# Patient Record
Sex: Male | Born: 1965 | Race: White | Hispanic: No | Marital: Married | Smoking: Current some day smoker
Health system: Southern US, Community
[De-identification: ages and names within clinical notes are randomized; demographics above are authoritative.]

## PROBLEM LIST (undated history)

## (undated) DIAGNOSIS — D649 Anemia, unspecified: Secondary | ICD-10-CM

## (undated) DIAGNOSIS — F1910 Other psychoactive substance abuse, uncomplicated: Secondary | ICD-10-CM

## (undated) DIAGNOSIS — K429 Umbilical hernia without obstruction or gangrene: Secondary | ICD-10-CM

## (undated) DIAGNOSIS — K219 Gastro-esophageal reflux disease without esophagitis: Secondary | ICD-10-CM

## (undated) DIAGNOSIS — B019 Varicella without complication: Secondary | ICD-10-CM

## (undated) DIAGNOSIS — J439 Emphysema, unspecified: Secondary | ICD-10-CM

## (undated) HISTORY — DX: Other psychoactive substance abuse, uncomplicated: F19.10

## (undated) HISTORY — DX: Varicella without complication: B01.9

## (undated) HISTORY — DX: Gastro-esophageal reflux disease without esophagitis: K21.9

## (undated) HISTORY — PX: POLYPECTOMY: SHX149

## (undated) HISTORY — DX: Emphysema, unspecified: J43.9

## (undated) HISTORY — DX: Anemia, unspecified: D64.9

## (undated) HISTORY — PX: TONSILLECTOMY: SUR1361

## (undated) HISTORY — PX: WISDOM TOOTH EXTRACTION: SHX21

---

## 1993-12-19 HISTORY — PX: INGUINAL HERNIA REPAIR: SUR1180

## 2007-12-20 HISTORY — PX: ACHILLES TENDON REPAIR: SUR1153

## 2010-03-15 ENCOUNTER — Emergency Department (HOSPITAL_COMMUNITY): Admission: EM | Admit: 2010-03-15 | Discharge: 2010-03-15 | Payer: Self-pay | Admitting: Family Medicine

## 2010-05-17 ENCOUNTER — Emergency Department (HOSPITAL_COMMUNITY): Admission: EM | Admit: 2010-05-17 | Discharge: 2010-05-17 | Payer: Self-pay | Admitting: Family Medicine

## 2013-05-03 ENCOUNTER — Ambulatory Visit: Payer: Self-pay | Admitting: Internal Medicine

## 2015-11-17 ENCOUNTER — Observation Stay (HOSPITAL_COMMUNITY): Payer: PRIVATE HEALTH INSURANCE | Admitting: Certified Registered"

## 2015-11-17 ENCOUNTER — Observation Stay (HOSPITAL_COMMUNITY)
Admission: EM | Admit: 2015-11-17 | Discharge: 2015-11-18 | Disposition: A | Discharge status: Home / Self Care | Payer: PRIVATE HEALTH INSURANCE | Attending: General Surgery | Admitting: General Surgery

## 2015-11-17 ENCOUNTER — Encounter (HOSPITAL_COMMUNITY): Payer: Self-pay | Admitting: Emergency Medicine

## 2015-11-17 ENCOUNTER — Encounter (HOSPITAL_COMMUNITY)
Admission: EM | Disposition: A | Discharge status: Home / Self Care | Payer: Self-pay | Source: Home / Self Care | Attending: Physician Assistant

## 2015-11-17 ENCOUNTER — Emergency Department (HOSPITAL_COMMUNITY): Payer: PRIVATE HEALTH INSURANCE

## 2015-11-17 DIAGNOSIS — F17210 Nicotine dependence, cigarettes, uncomplicated: Secondary | ICD-10-CM | POA: Insufficient documentation

## 2015-11-17 DIAGNOSIS — R1084 Generalized abdominal pain: Secondary | ICD-10-CM | POA: Diagnosis present

## 2015-11-17 DIAGNOSIS — K3580 Unspecified acute appendicitis: Secondary | ICD-10-CM | POA: Diagnosis not present

## 2015-11-17 HISTORY — DX: Umbilical hernia without obstruction or gangrene: K42.9

## 2015-11-17 HISTORY — PX: APPENDECTOMY: SHX54

## 2015-11-17 HISTORY — PX: LAPAROSCOPIC APPENDECTOMY: SHX408

## 2015-11-17 HISTORY — DX: Unspecified acute appendicitis: K35.80

## 2015-11-17 LAB — URINALYSIS, ROUTINE W REFLEX MICROSCOPIC
BILIRUBIN URINE: NEGATIVE
GLUCOSE, UA: NEGATIVE mg/dL
Hgb urine dipstick: NEGATIVE
Ketones, ur: 15 mg/dL — AB
Nitrite: NEGATIVE
Protein, ur: NEGATIVE mg/dL
Specific Gravity, Urine: 1.028 (ref 1.005–1.030)
pH: 6 (ref 5.0–8.0)

## 2015-11-17 LAB — COMPREHENSIVE METABOLIC PANEL
ALT: 13 U/L — ABNORMAL LOW (ref 17–63)
ANION GAP: 9 (ref 5–15)
AST: 21 U/L (ref 15–41)
Albumin: 4.2 g/dL (ref 3.5–5.0)
Alkaline Phosphatase: 53 U/L (ref 38–126)
BILIRUBIN TOTAL: 0.9 mg/dL (ref 0.3–1.2)
BUN: 8 mg/dL (ref 6–20)
CHLORIDE: 106 mmol/L (ref 101–111)
CO2: 22 mmol/L (ref 22–32)
Calcium: 8.9 mg/dL (ref 8.9–10.3)
Creatinine, Ser: 0.93 mg/dL (ref 0.61–1.24)
Glucose, Bld: 122 mg/dL — ABNORMAL HIGH (ref 65–99)
POTASSIUM: 3.6 mmol/L (ref 3.5–5.1)
Sodium: 137 mmol/L (ref 135–145)
TOTAL PROTEIN: 6.8 g/dL (ref 6.5–8.1)

## 2015-11-17 LAB — CBC WITH DIFFERENTIAL/PLATELET
BASOS ABS: 0 10*3/uL (ref 0.0–0.1)
Basophils Relative: 0 %
EOS PCT: 0 %
Eosinophils Absolute: 0 10*3/uL (ref 0.0–0.7)
HEMATOCRIT: 43.3 % (ref 39.0–52.0)
Hemoglobin: 15.2 g/dL (ref 13.0–17.0)
LYMPHS ABS: 1.1 10*3/uL (ref 0.7–4.0)
Lymphocytes Relative: 8 %
MCH: 34.2 pg — AB (ref 26.0–34.0)
MCHC: 35.1 g/dL (ref 30.0–36.0)
MCV: 97.5 fL (ref 78.0–100.0)
MONO ABS: 1.3 10*3/uL — AB (ref 0.1–1.0)
MONOS PCT: 9 %
NEUTROS ABS: 11.8 10*3/uL — AB (ref 1.7–7.7)
Neutrophils Relative %: 83 %
PLATELETS: 215 10*3/uL (ref 150–400)
RBC: 4.44 MIL/uL (ref 4.22–5.81)
RDW: 12 % (ref 11.5–15.5)
WBC: 14.2 10*3/uL — ABNORMAL HIGH (ref 4.0–10.5)

## 2015-11-17 LAB — URINE MICROSCOPIC-ADD ON

## 2015-11-17 LAB — LIPASE, BLOOD: LIPASE: 27 U/L (ref 11–51)

## 2015-11-17 SURGERY — APPENDECTOMY, LAPAROSCOPIC
Anesthesia: General | Site: Abdomen

## 2015-11-17 MED ORDER — ROCURONIUM BROMIDE 100 MG/10ML IV SOLN
INTRAVENOUS | Status: DC | PRN
  Administered 2015-11-17: 30 mg via INTRAVENOUS

## 2015-11-17 MED ORDER — IOHEXOL 300 MG/ML  SOLN
100.0000 mL | Freq: Once | INTRAMUSCULAR | Status: AC | PRN
  Administered 2015-11-17: 100 mL via INTRAVENOUS

## 2015-11-17 MED ORDER — ONDANSETRON HCL 4 MG/2ML IJ SOLN
4.0000 mg | Freq: Once | INTRAMUSCULAR | Status: AC
  Administered 2015-11-17: 4 mg via INTRAVENOUS
  Filled 2015-11-17: qty 2

## 2015-11-17 MED ORDER — POTASSIUM CHLORIDE IN NACL 20-0.9 MEQ/L-% IV SOLN
INTRAVENOUS | Status: DC
  Administered 2015-11-17 – 2015-11-18 (×2): via INTRAVENOUS
  Filled 2015-11-17 (×2): qty 1000

## 2015-11-17 MED ORDER — ONDANSETRON 4 MG PO TBDP: 4.0000 mg | ORAL_TABLET | Freq: Four times a day (QID) | ORAL | Status: DC | PRN

## 2015-11-17 MED ORDER — OXYCODONE HCL 5 MG/5ML PO SOLN: 5.0000 mg | Freq: Once | ORAL | Status: DC | PRN

## 2015-11-17 MED ORDER — SODIUM CHLORIDE 0.9 % IR SOLN
Status: DC | PRN
  Administered 2015-11-17: 1000 mL

## 2015-11-17 MED ORDER — FENTANYL CITRATE (PF) 250 MCG/5ML IJ SOLN
INTRAMUSCULAR | Status: AC
  Filled 2015-11-17: qty 5

## 2015-11-17 MED ORDER — DEXTROSE 5 % IV SOLN
2.0000 g | INTRAVENOUS | Status: DC
  Administered 2015-11-17: 2 g via INTRAVENOUS
  Filled 2015-11-17: qty 2

## 2015-11-17 MED ORDER — KETOROLAC TROMETHAMINE 30 MG/ML IJ SOLN
30.0000 mg | Freq: Once | INTRAMUSCULAR | Status: AC
  Administered 2015-11-17: 30 mg via INTRAVENOUS
  Filled 2015-11-17: qty 1

## 2015-11-17 MED ORDER — SUCCINYLCHOLINE CHLORIDE 20 MG/ML IJ SOLN
INTRAMUSCULAR | Status: DC | PRN
  Administered 2015-11-17: 150 mg via INTRAVENOUS

## 2015-11-17 MED ORDER — PROPOFOL 10 MG/ML IV BOLUS
INTRAVENOUS | Status: AC
  Filled 2015-11-17: qty 20

## 2015-11-17 MED ORDER — SUGAMMADEX SODIUM 200 MG/2ML IV SOLN
INTRAVENOUS | Status: AC
  Filled 2015-11-17: qty 2

## 2015-11-17 MED ORDER — MIDAZOLAM HCL 5 MG/5ML IJ SOLN
INTRAMUSCULAR | Status: DC | PRN
  Administered 2015-11-17: 2 mg via INTRAVENOUS

## 2015-11-17 MED ORDER — MIDAZOLAM HCL 2 MG/2ML IJ SOLN
INTRAMUSCULAR | Status: AC
  Filled 2015-11-17: qty 2

## 2015-11-17 MED ORDER — ONDANSETRON HCL 4 MG/2ML IJ SOLN: 4.0000 mg | Freq: Once | INTRAMUSCULAR | Status: DC | PRN

## 2015-11-17 MED ORDER — METRONIDAZOLE IN NACL 5-0.79 MG/ML-% IV SOLN
500.0000 mg | Freq: Three times a day (TID) | INTRAVENOUS | Status: DC
  Administered 2015-11-17: 500 mg via INTRAVENOUS
  Filled 2015-11-17 (×3): qty 100

## 2015-11-17 MED ORDER — SUGAMMADEX SODIUM 200 MG/2ML IV SOLN
INTRAVENOUS | Status: DC | PRN
  Administered 2015-11-17: 158.8 mg via INTRAVENOUS

## 2015-11-17 MED ORDER — 0.9 % SODIUM CHLORIDE (POUR BTL) OPTIME
TOPICAL | Status: DC | PRN
  Administered 2015-11-17: 1000 mL

## 2015-11-17 MED ORDER — PROPOFOL 10 MG/ML IV BOLUS
INTRAVENOUS | Status: DC | PRN
  Administered 2015-11-17: 170 mg via INTRAVENOUS

## 2015-11-17 MED ORDER — LIDOCAINE HCL 4 % MT SOLN
OROMUCOSAL | Status: DC | PRN
  Administered 2015-11-17: 4 mL via TOPICAL

## 2015-11-17 MED ORDER — LACTATED RINGERS IV SOLN
INTRAVENOUS | Status: DC
  Administered 2015-11-17: 12:00:00 via INTRAVENOUS

## 2015-11-17 MED ORDER — OXYCODONE HCL 5 MG PO TABS: 5.0000 mg | ORAL_TABLET | Freq: Once | ORAL | Status: DC | PRN

## 2015-11-17 MED ORDER — LIDOCAINE HCL (CARDIAC) 20 MG/ML IV SOLN
INTRAVENOUS | Status: DC | PRN
  Administered 2015-11-17: 50 mg via INTRAVENOUS

## 2015-11-17 MED ORDER — METHOCARBAMOL 500 MG PO TABS: 500.0000 mg | ORAL_TABLET | Freq: Four times a day (QID) | ORAL | Status: DC | PRN

## 2015-11-17 MED ORDER — DIPHENHYDRAMINE HCL 50 MG/ML IJ SOLN: 25.0000 mg | Freq: Four times a day (QID) | INTRAMUSCULAR | Status: DC | PRN

## 2015-11-17 MED ORDER — HYDROMORPHONE HCL 1 MG/ML IJ SOLN: 0.2500 mg | INTRAMUSCULAR | Status: DC | PRN

## 2015-11-17 MED ORDER — FENTANYL CITRATE (PF) 100 MCG/2ML IJ SOLN
INTRAMUSCULAR | Status: DC | PRN
  Administered 2015-11-17 (×5): 50 ug via INTRAVENOUS

## 2015-11-17 MED ORDER — BUPIVACAINE HCL (PF) 0.25 % IJ SOLN
INTRAMUSCULAR | Status: AC
  Filled 2015-11-17: qty 30

## 2015-11-17 MED ORDER — ACETAMINOPHEN 650 MG RE SUPP: 650.0000 mg | Freq: Four times a day (QID) | RECTAL | Status: DC | PRN

## 2015-11-17 MED ORDER — ONDANSETRON HCL 4 MG/2ML IJ SOLN
INTRAMUSCULAR | Status: DC | PRN
  Administered 2015-11-17: 4 mg via INTRAVENOUS

## 2015-11-17 MED ORDER — HYDROCODONE-ACETAMINOPHEN 5-325 MG PO TABS
1.0000 | ORAL_TABLET | ORAL | Status: DC | PRN
  Administered 2015-11-17: 2 via ORAL
  Filled 2015-11-17: qty 2

## 2015-11-17 MED ORDER — DIPHENHYDRAMINE HCL 25 MG PO CAPS: 25.0000 mg | ORAL_CAPSULE | Freq: Four times a day (QID) | ORAL | Status: DC | PRN

## 2015-11-17 MED ORDER — MORPHINE SULFATE (PF) 2 MG/ML IV SOLN: 1.0000 mg | INTRAVENOUS | Status: DC | PRN

## 2015-11-17 MED ORDER — HYDRALAZINE HCL 20 MG/ML IJ SOLN: 10.0000 mg | INTRAMUSCULAR | Status: DC | PRN

## 2015-11-17 MED ORDER — BUPIVACAINE HCL 0.25 % IJ SOLN
INTRAMUSCULAR | Status: DC | PRN
  Administered 2015-11-17: 5 mL

## 2015-11-17 MED ORDER — ACETAMINOPHEN 325 MG PO TABS
650.0000 mg | ORAL_TABLET | Freq: Four times a day (QID) | ORAL | Status: DC | PRN
  Administered 2015-11-18: 650 mg via ORAL
  Filled 2015-11-17: qty 2

## 2015-11-17 MED ORDER — ONDANSETRON HCL 4 MG/2ML IJ SOLN
4.0000 mg | Freq: Four times a day (QID) | INTRAMUSCULAR | Status: DC | PRN
  Administered 2015-11-18: 4 mg via INTRAVENOUS
  Filled 2015-11-17: qty 2

## 2015-11-17 MED ORDER — LACTATED RINGERS IV SOLN
INTRAVENOUS | Status: DC | PRN
  Administered 2015-11-17 (×2): via INTRAVENOUS

## 2015-11-17 MED ORDER — POVIDONE-IODINE 10 % EX SOLN
CUTANEOUS | Status: DC | PRN
  Administered 2015-11-17: 1 via TOPICAL

## 2015-11-17 SURGICAL SUPPLY — 45 items
APPLIER CLIP 5 13 M/L LIGAMAX5 (MISCELLANEOUS)
BENZOIN TINCTURE PRP APPL 2/3 (GAUZE/BANDAGES/DRESSINGS) ×2 IMPLANT
BLADE SURG ROTATE 9660 (MISCELLANEOUS) ×2 IMPLANT
CANISTER SUCTION 2500CC (MISCELLANEOUS) ×2 IMPLANT
CHLORAPREP W/TINT 26ML (MISCELLANEOUS) ×2 IMPLANT
CLIP APPLIE 5 13 M/L LIGAMAX5 (MISCELLANEOUS) IMPLANT
COVER SURGICAL LIGHT HANDLE (MISCELLANEOUS) ×2 IMPLANT
COVER TRANSDUCER ULTRASND (DRAPES) ×2 IMPLANT
DEVICE TROCAR PUNCTURE CLOSURE (ENDOMECHANICALS) ×2 IMPLANT
ELECT REM PT RETURN 9FT ADLT (ELECTROSURGICAL) ×2
ELECTRODE REM PT RTRN 9FT ADLT (ELECTROSURGICAL) ×1 IMPLANT
ENDOLOOP SUT PDS II  0 18 (SUTURE) ×3
ENDOLOOP SUT PDS II 0 18 (SUTURE) ×3 IMPLANT
GAUZE SPONGE 2X2 8PLY STRL LF (GAUZE/BANDAGES/DRESSINGS) ×1 IMPLANT
GLOVE BIO SURGEON STRL SZ 6 (GLOVE) ×2 IMPLANT
GLOVE BIO SURGEON STRL SZ 6.5 (GLOVE) ×2 IMPLANT
GLOVE BIO SURGEON STRL SZ7 (GLOVE) ×2 IMPLANT
GLOVE BIO SURGEON STRL SZ7.5 (GLOVE) ×2 IMPLANT
GLOVE BIOGEL PI IND STRL 7.0 (GLOVE) ×2 IMPLANT
GLOVE BIOGEL PI INDICATOR 7.0 (GLOVE) ×2
GOWN STRL REUS W/ TWL LRG LVL3 (GOWN DISPOSABLE) ×2 IMPLANT
GOWN STRL REUS W/ TWL XL LVL3 (GOWN DISPOSABLE) ×1 IMPLANT
GOWN STRL REUS W/TWL LRG LVL3 (GOWN DISPOSABLE) ×2
GOWN STRL REUS W/TWL XL LVL3 (GOWN DISPOSABLE) ×1
KIT BASIN OR (CUSTOM PROCEDURE TRAY) ×2 IMPLANT
KIT ROOM TURNOVER OR (KITS) ×2 IMPLANT
NEEDLE INSUFFLATION 14GA 120MM (NEEDLE) ×2 IMPLANT
NS IRRIG 1000ML POUR BTL (IV SOLUTION) ×2 IMPLANT
PAD ARMBOARD 7.5X6 YLW CONV (MISCELLANEOUS) ×4 IMPLANT
SCISSORS LAP 5X35 DISP (ENDOMECHANICALS) ×2 IMPLANT
SET IRRIG TUBING LAPAROSCOPIC (IRRIGATION / IRRIGATOR) ×2 IMPLANT
SLEEVE ENDOPATH XCEL 5M (ENDOMECHANICALS) ×2 IMPLANT
SPECIMEN JAR SMALL (MISCELLANEOUS) ×2 IMPLANT
SPONGE GAUZE 2X2 STER 10/PKG (GAUZE/BANDAGES/DRESSINGS) ×1
STRIP CLOSURE SKIN 1/2X4 (GAUZE/BANDAGES/DRESSINGS) ×2 IMPLANT
SUT MNCRL AB 3-0 PS2 18 (SUTURE) ×2 IMPLANT
SUT SILK 2 0 SH (SUTURE) IMPLANT
TAPE CLOTH SOFT 2X10 (GAUZE/BANDAGES/DRESSINGS) ×2 IMPLANT
TOWEL OR 17X24 6PK STRL BLUE (TOWEL DISPOSABLE) ×2 IMPLANT
TOWEL OR 17X26 10 PK STRL BLUE (TOWEL DISPOSABLE) ×2 IMPLANT
TRAY FOLEY CATH 16FR SILVER (SET/KITS/TRAYS/PACK) ×2 IMPLANT
TRAY LAPAROSCOPIC MC (CUSTOM PROCEDURE TRAY) ×2 IMPLANT
TROCAR XCEL NON-BLD 11X100MML (ENDOMECHANICALS) ×2 IMPLANT
TROCAR XCEL NON-BLD 5MMX100MML (ENDOMECHANICALS) ×2 IMPLANT
TUBING INSUFFLATION (TUBING) ×2 IMPLANT

## 2015-11-17 NOTE — Anesthesia Procedure Notes (Signed)
Procedure Name: Intubation Date/Time: 11/17/2015 12:17 PM Performed by: Suzy Bouchard Pre-anesthesia Checklist: Patient identified, Emergency Drugs available, Suction available, Timeout performed and Patient being monitored Patient Re-evaluated:Patient Re-evaluated prior to inductionOxygen Delivery Method: Circle system utilized Preoxygenation: Pre-oxygenation with 100% oxygen Intubation Type: IV induction, Rapid sequence and Cricoid Pressure applied Laryngoscope Size: Miller and 2 Grade View: Grade I Tube type: Oral Laser Tube: Cuffed inflated with minimal occlusive pressure - saline Tube size: 7.5 mm Airway Equipment and Method: Stylet and LTA kit utilized Placement Confirmation: ETT inserted through vocal cords under direct vision,  positive ETCO2 and breath sounds checked- equal and bilateral Secured at: 21 cm Tube secured with: Tape Dental Injury: Teeth and Oropharynx as per pre-operative assessment

## 2015-11-17 NOTE — H&P (Signed)
[facility] Admission Note  Dennis Richard Aug 09, 1966  [id number].    Requesting MD: Delrae Rend, PA-C Chief Complaint/Reason for Consult: Acute appendicitis  HPI:  49 y/o white male with history of tobacco use, bilateral inguinal hernias s/p repair 1995, and unrepaired umbilical hernia who presents to [facility] for evaluation of abdominal pain.  He states he was in his usual state of health until yesterday afternoon when he started experiencing mid abdominal pain while at work.  His pain is now in his RLQ although much improved since administartion of pain medication.  Pain radiates to his right lower back.  5/10 pain currently.  No precipitating or alleviating factors.  He reports associated nausea, dry heaving, chills, and loss of appetite.  Attempted self induce vomiting because he thought it was gas pains.  He has an umbilical hernia which bothers him sometimes, but does not have any current pain.  Denies obstructive symptoms.  BM and flatus daily.  Last meal 3pm yesterday.  States he has been able to keep fluids down.  Denies diarrhea, fever, dysuria, chest pain, SOB.   Endorses a few beers/wine a night and a 6-8 beers on the weekend.  Smokes 1/2 PPD, no drug use.  Also c/o left hip pain and wanted to know if his CT showed any fractures.  He notes he had a right skin infection after his right inguinal hernia repair in 1995.   ROS: All systems reviewed and otherwise negative except for as above  No family history on file.  Past Medical History  Diagnosis Date  . Umbilical hernia     Past Surgical History  Procedure Laterality Date  . Inguinal hernia repair Bilateral [place]  . Ankle surgery Right 2009    achilles rupture and repair  . Tonsilectomy/adenoidectomy with myringotomy      49 y/o     Social History:  reports that he has been smoking Cigarettes.  He has a 16 pack-year smoking history. He does not have any smokeless tobacco history on file. He  reports that he drinks alcohol. He reports that he does not use illicit drugs.  Allergies: No Known Allergies   (Not in a hospital admission)  Blood pressure 118/75, pulse 93, temperature 99.1 F (37.3 C), temperature source Oral, resp. rate 16, height $RemoveBe'5\' 7"'JXITwsKoO$  (1.702 m), weight 79.379 kg (175 lb), SpO2 92 %. Physical Exam: General: pleasant, WD/WN white male who is laying in bed in NAD HEENT: head is normocephalic, atraumatic.  Sclera are noninjected.  PERRL.  Ears and nose without any masses or lesions.  Mouth is pink and moist Heart: regular, rate, and rhythm.  No obvious murmurs, gallops, or rubs noted.  Palpable pedal pulses bilaterally Lungs: CTAB, no wheezes, rhonchi, or rales noted.  Respiratory effort nonlabored Abd: soft, tender in RLQ, ND, +BS, no masses, hernias, or organomegaly, b/l inguinal scars well healed, no inguinal hernia.  Soft reducible umbilical hernia containing fat per CT. MS: all 4 extremities are symmetrical with no cyanosis, clubbing, or edema. Skin: warm and dry with no masses, lesions, or rashes Psych: A&Ox3 with an appropriate affect.   Results for orders placed or performed during the hospital encounter of 11/17/15 (from the past 48 hour(s))  CBC with Differential     Status: Abnormal   Collection Time: 11/17/15  8:00 AM  Result Value Ref Range   WBC 14.2 (H) 4.0 - 10.5 K/uL   RBC 4.44 4.22 - 5.81 MIL/uL   Hemoglobin  15.2 13.0 - 17.0 g/dL   HCT 43.3 39.0 - 52.0 %   MCV 97.5 78.0 - 100.0 fL   MCH 34.2 (H) 26.0 - 34.0 pg   MCHC 35.1 30.0 - 36.0 g/dL   RDW 12.0 11.5 - 15.5 %   Platelets 215 150 - 400 K/uL   Neutrophils Relative % 83 %   Neutro Abs 11.8 (H) 1.7 - 7.7 K/uL   Lymphocytes Relative 8 %   Lymphs Abs 1.1 0.7 - 4.0 K/uL   Monocytes Relative 9 %   Monocytes Absolute 1.3 (H) 0.1 - 1.0 K/uL   Eosinophils Relative 0 %   Eosinophils Absolute 0.0 0.0 - 0.7 K/uL   Basophils Relative 0 %   Basophils Absolute 0.0 0.0 - 0.1 K/uL  Comprehensive  metabolic panel     Status: Abnormal   Collection Time: 11/17/15  8:00 AM  Result Value Ref Range   Sodium 137 135 - 145 mmol/L   Potassium 3.6 3.5 - 5.1 mmol/L   Chloride 106 101 - 111 mmol/L   CO2 22 22 - 32 mmol/L   Glucose, Bld 122 (H) 65 - 99 mg/dL   BUN 8 6 - 20 mg/dL   Creatinine, Ser 0.93 0.61 - 1.24 mg/dL   Calcium 8.9 8.9 - 10.3 mg/dL   Total Protein 6.8 6.5 - 8.1 g/dL   Albumin 4.2 3.5 - 5.0 g/dL   AST 21 15 - 41 U/L   ALT 13 (L) 17 - 63 U/L   Alkaline Phosphatase 53 38 - 126 U/L   Total Bilirubin 0.9 0.3 - 1.2 mg/dL   GFR calc non Af Amer >60 >60 mL/min   GFR calc Af Amer >60 >60 mL/min    Comment: (NOTE) The eGFR has been calculated using the CKD EPI equation. This calculation has not been validated in all clinical situations. eGFR's persistently <60 mL/min signify possible Chronic Kidney Disease.    Anion gap 9 5 - 15  Lipase, blood     Status: None   Collection Time: 11/17/15  8:00 AM  Result Value Ref Range   Lipase 27 11 - 51 U/L  Urinalysis, Routine w reflex microscopic (not at [facility])     Status: Abnormal   Collection Time: 11/17/15  8:40 AM  Result Value Ref Range   Color, Urine YELLOW YELLOW   APPearance CLEAR CLEAR   Specific Gravity, Urine 1.028 1.005 - 1.030   pH 6.0 5.0 - 8.0   Glucose, UA NEGATIVE NEGATIVE mg/dL   Hgb urine dipstick NEGATIVE NEGATIVE   Bilirubin Urine NEGATIVE NEGATIVE   Ketones, ur 15 (A) NEGATIVE mg/dL   Protein, ur NEGATIVE NEGATIVE mg/dL   Nitrite NEGATIVE NEGATIVE   Leukocytes, UA TRACE (A) NEGATIVE  Urine microscopic-add on     Status: Abnormal   Collection Time: 11/17/15  8:40 AM  Result Value Ref Range   Squamous Epithelial / LPF 0-5 (A) NONE SEEN    Comment: Please note change in reference range.   WBC, UA 0-5 0 - 5 WBC/hpf    Comment: Please note change in reference range.   RBC / HPF 0-5 0 - 5 RBC/hpf    Comment: Please note change in reference range.   Bacteria, UA RARE (A) NONE SEEN    Comment: Please note  change in reference range.   Urine-Other MUCOUS PRESENT    Ct Abdomen Pelvis W Contrast  11/17/2015  CLINICAL DATA:  Diffuse abdominal pain starting yesterday p.m., nausea EXAM: CT ABDOMEN AND  PELVIS WITH CONTRAST TECHNIQUE: Multidetector CT imaging of the abdomen and pelvis was performed using the standard protocol following bolus administration of intravenous contrast. CONTRAST:  115mL OMNIPAQUE IOHEXOL 300 MG/ML  SOLN COMPARISON:  None. FINDINGS: The lung bases are unremarkable. Sagittal images of the spine shows degenerative changes lumbar spine. Disc space flattening with anterior spurring at L3-L4 and L4-L5 level. Enhanced liver pancreas and adrenal glands are unremarkable. There is a lobulated splenic cyst measures 3.2 by 1.9 cm. No aortic aneurysm. Kidneys are symmetrical in size and enhancement. No hydronephrosis or hydroureter. Delayed renal images shows bilateral renal symmetrical excretion. Bilateral visualized proximal ureter is unremarkable. Moderate stool noted in right colon and transverse colon. There is nonspecific mild thickening of cecal wall. The terminal ileum is unremarkable. There is abnormal enhancement and thickening of the appendix. Stranding of surrounding fat. Small amount of surrounding fluid. The appendix is best visualized in axial image 68 measures 1 cm in diameter. Findings are consistent with acute appendicitis. Prostate gland and seminal vesicles are unremarkable. No inguinal adenopathy. The urinary bladder is under distended. Nonspecific mild thickening of urinary bladder wall. There is a umbilical hernia containing fat measures 2.6 x 2.4 cm without evidence of acute complication. IMPRESSION: 1. There is abnormal enhancement and thickening of the appendix. Small amount of periappendiceal fluid. Findings are consistent with acute appendicitis. No definite evidence of perforation. 2. Nonspecific mild thickening of cecal wall probable satellite inflammation. 3. No small bowel  obstruction. 4. Unremarkable terminal ileum. 5. Moderate stool in right colon and transverse colon. 6. There is a umbilical hernia containing fat measures 2.6 x 2.4 cm. No evidence of acute complication. These results were called by telephone at the time of interpretation on 11/17/2015 at 9:12 am to Dr. Delrae Rend , who verbally acknowledged these results. Electronically Signed   By: Lahoma Crocker M.D.   On: 11/17/2015 09:12      Assessment/Plan Acute appendicitis Leukocytosis  Plan: 1.  Admit to [facility], urgent OR for lap appy 2.  NPO, bowel rest, IVF, pain control, antiemetics, antibiotics (rocephin/flagyl) 3.  Discussed conservative management vs surgical interventions.  Discussed risks including bleeding, infection, injury to intra-abdominal structures, conversion to open, leak and drain placement.  The patient would like to proceed with surgical interventions.  His wife will come to the hospital after 3pm when she picks up their child at school.   4.  Discussed post-op course, patient was interested in discharging home today if possible.   Nat Christen, [facility] 11/17/2015, 10:26 AM Pager: [phone]

## 2015-11-17 NOTE — Transfer of Care (Signed)
Immediate Anesthesia Transfer of Care Note  Patient: Dennis Richard  Procedure(s) Performed: Procedure(s): APPENDECTOMY LAPAROSCOPIC (N/A)  Patient Location: PACU  Anesthesia Type:General  Level of Consciousness: sedated  Airway & Oxygen Therapy: Patient Spontanous Breathing and Patient connected to nasal cannula oxygen  Post-op Assessment: Report given to RN and Post -op Vital signs reviewed and stable  Post vital signs: Reviewed and stable  Last Vitals:  Filed Vitals:   11/17/15 1000 11/17/15 1100  BP: 117/76 110/79  Pulse: 89 87  Temp:    Resp:      Complications: No apparent anesthesia complications

## 2015-11-17 NOTE — ED Provider Notes (Signed)
CSN: [id number]     Arrival date & time 11/17/15  0734 History   First MD Initiated Contact with Patient 11/17/15 [phone]     No chief complaint on file.  HPI   Dennis Richard is an 49 y.o. male with history of bilateral inguinal hernias s/p repair and unrepaired umbilical hernia who presents to the ED for evaluation of abdominal pain. He states he was in his usual state of health until yesterday afternoon when he started experiencing epigastric pain. He states the pain has now migrated to his RLQ. He reports associated nausea and loss of appetite. Denies emesis. States he has been able to keep fluids down. Denies diarrhea. Denies fever, chills, dysuria, chest pain, SOB. He states he still has his appendix and gallbladder. Endorses a few beers a night and a six pack on the weekend. Denies tobacco, MJ, or other drugs.   No past medical history on file. No past surgical history on file. No family history on file. Social History  Substance Use Topics  . Smoking status: Not on file  . Smokeless tobacco: Not on file  . Alcohol Use: Not on file    Review of Systems  All other systems reviewed and are negative.     Allergies  Review of patient's allergies indicates not on file.  Home Medications   Prior to Admission medications   Not on File   There were no vitals taken for this visit. Physical Exam  Constitutional: He is oriented to person, place, and time. No distress.  HENT:  Right Ear: External ear normal.  Left Ear: External ear normal.  Nose: Nose normal.  Mouth/Throat: Oropharynx is clear and moist. No oropharyngeal exudate.  Eyes: Conjunctivae and EOM are normal. Pupils are equal, round, and reactive to light.  Neck: Normal range of motion. Neck supple.  Cardiovascular: Normal rate, regular rhythm, normal heart sounds and intact distal pulses.   Pulmonary/Chest: Effort normal and breath sounds normal. No respiratory distress. He has no wheezes.  Abdominal: Soft. Bowel sounds are  normal. He exhibits no distension. There is no rebound, no guarding and no CVA tenderness.  Marked +TTP RLQ. Negative rebound, rovsing's. No guarding. Mild epigastric tenderness. There is also a small umbilical hernia that is easily reducible. No strangulation. Nontender.   Musculoskeletal: He exhibits no edema.  Neurological: He is alert and oriented to person, place, and time. No cranial nerve deficit.  Skin: Skin is warm and dry. He is not diaphoretic.  Psychiatric: He has a normal mood and affect.  Nursing note and vitals reviewed.   ED Course  Procedures (including critical care time) Labs Review Labs Reviewed  CBC WITH DIFFERENTIAL/PLATELET - Abnormal; Notable for the following:    WBC 14.2 (*)    MCH 34.2 (*)    Neutro Abs 11.8 (*)    Monocytes Absolute 1.3 (*)    All other components within normal limits  COMPREHENSIVE METABOLIC PANEL - Abnormal; Notable for the following:    Glucose, Bld 122 (*)    ALT 13 (*)    All other components within normal limits  URINALYSIS, ROUTINE W REFLEX MICROSCOPIC (NOT AT [facility]) - Abnormal; Notable for the following:    Ketones, ur 15 (*)    Leukocytes, UA TRACE (*)    All other components within normal limits  URINE MICROSCOPIC-ADD ON - Abnormal; Notable for the following:    Squamous Epithelial / LPF 0-5 (*)    Bacteria, UA RARE (*)    All other  components within normal limits  LIPASE, BLOOD    Imaging Review Ct Abdomen Pelvis W Contrast  11/17/2015  CLINICAL DATA:  Diffuse abdominal pain starting yesterday p.m., nausea EXAM: CT ABDOMEN AND PELVIS WITH CONTRAST TECHNIQUE: Multidetector CT imaging of the abdomen and pelvis was performed using the standard protocol following bolus administration of intravenous contrast. CONTRAST:  170mL OMNIPAQUE IOHEXOL 300 MG/ML  SOLN COMPARISON:  None. FINDINGS: The lung bases are unremarkable. Sagittal images of the spine shows degenerative changes lumbar spine. Disc space flattening with anterior  spurring at L3-L4 and L4-L5 level. Enhanced liver pancreas and adrenal glands are unremarkable. There is a lobulated splenic cyst measures 3.2 by 1.9 cm. No aortic aneurysm. Kidneys are symmetrical in size and enhancement. No hydronephrosis or hydroureter. Delayed renal images shows bilateral renal symmetrical excretion. Bilateral visualized proximal ureter is unremarkable. Moderate stool noted in right colon and transverse colon. There is nonspecific mild thickening of cecal wall. The terminal ileum is unremarkable. There is abnormal enhancement and thickening of the appendix. Stranding of surrounding fat. Small amount of surrounding fluid. The appendix is best visualized in axial image 68 measures 1 cm in diameter. Findings are consistent with acute appendicitis. Prostate gland and seminal vesicles are unremarkable. No inguinal adenopathy. The urinary bladder is under distended. Nonspecific mild thickening of urinary bladder wall. There is a umbilical hernia containing fat measures 2.6 x 2.4 cm without evidence of acute complication. IMPRESSION: 1. There is abnormal enhancement and thickening of the appendix. Small amount of periappendiceal fluid. Findings are consistent with acute appendicitis. No definite evidence of perforation. 2. Nonspecific mild thickening of cecal wall probable satellite inflammation. 3. No small bowel obstruction. 4. Unremarkable terminal ileum. 5. Moderate stool in right colon and transverse colon. 6. There is a umbilical hernia containing fat measures 2.6 x 2.4 cm. No evidence of acute complication. These results were called by telephone at the time of interpretation on 11/17/2015 at 9:12 am to Dr. Delrae Rend , who verbally acknowledged these results. Electronically Signed   By: Lahoma Crocker M.D.   On: 11/17/2015 09:12   I have personally reviewed and evaluated these images and lab results as part of my medical decision-making.   EKG Interpretation None      MDM   Final  diagnoses:  Acute appendicitis, unspecified acute appendicitis type    Given pt's tenderness, acute onset of pain, nausea, and loss of appetite, I suspect possible appendicitis. Lower suspicion for cholecystitis or pancreatitis. Possible GERD. Will get cbc, bmp, UA. Will get CT abd/pelvis. Pain and nausea meds given.  CT shows evidence of acute appendicitis. No signs of rupture at this time.  WBC 14.2. UA shows signs of possible contamination. General surgery consulted for admission.    Anne Ng, PA-C 11/18/15 [id number]  Courteney Julio Alm, MD 11/19/15 [phone]

## 2015-11-17 NOTE — Op Note (Signed)
11/17/2015  1:16 PM  PATIENT:  Dennis Richard  49 y.o. male  PRE-OPERATIVE DIAGNOSIS:  Acute appendicitis  POST-OPERATIVE DIAGNOSIS:  Acute appendicitis  PROCEDURE:  Procedure(s): APPENDECTOMY LAPAROSCOPIC (N/A)  SURGEON:  Surgeon(s) and Role:    * Ralene Ok, MD - Primary  ANESTHESIA:   local and general  EBL: 10cc Total I/O In: 1000 [I.V.:1000] Out: 325 [Urine:300; Blood:25]  BLOOD ADMINISTERED:none  DRAINS: none   LOCAL MEDICATIONS USED:  BUPIVICAINE   SPECIMEN:  Source of Specimen:  appendix  DISPOSITION OF SPECIMEN:  PATHOLOGY  COUNTS:  YES  TOURNIQUET:  * No tourniquets in log *  DICTATION: .Dragon Dictation Complications: none  Counts: reported as correct x 2  Findings:  The patient had a acutely inflammed non perforated appendix  Specimen: Appendix  Indications for procedure:  The patient is a 49 year old male with a history of periumbilical pain localized in the right lower quadrant patient had a CT scan which revealed signs consistent with acute appendicitis the patient back in for laparoscopic appendectomy.  Details of the procedure:The patient was taken back to the operating room. The patient was placed in supine position with bilateral SCDs in place.  A foley catheter was place. The patient was prepped and draped in the usual sterile fashion.  After appropriate anitbiotics were confirmed, a time-out was confirmed and all facts were verified.    A pneumoperitoneum of 14 mmHg was obtained via a Veress needle technique in the left lower quadrant quadrant.  A 5 mm trocar and 5 mm camera then placed intra-abdominally there is no injury to any intra-abdominal organs a 10 mm infraumbilical port was placed and direct visualization as was a 5 mm port in the suprapubic area.   The appendix was identified and seen to be non-perforated.  The appendix was cleaned down to the appendiceal base. The mesoappendix was then incised and the appendiceal artery was  cauterized.  The the appendiceal base was clean.  At this time an Endoloop was placed proximallyx2 and one distally and the appendix was transected between these 2. A retrieval bag was then placed into the abdomen and the specimen placed in the bag. The appendiceal stump was cauterized. We evacuate the fluid from the pelvis until the effluent was clear.  The appendix and retrieval  bag was then retrieved via the supraumbilical port. #1 Vicryl was used to reapproximate the fascia at the umbilical port site x2. The hernia sac and preperitoneal fat was extracted. The skin was reapproximated all port sites 3-0 Monocryl subcuticular fashion. The skin was dressed with steri-strips, guaze, and tape.  The patient had the foley removed. The patient was awakened from general anesthesia was taken to recovery room in stable condition.      PLAN OF CARE: Admit for overnight observation  PATIENT DISPOSITION:  PACU - hemodynamically stable.   Delay start of Pharmacological VTE agent (>24hrs) due to surgical blood loss or risk of bleeding: not applicable

## 2015-11-17 NOTE — ED Notes (Signed)
Patient complains of abdominal pain related to an existing unrepaired hernia.  States he doesn't think the his hernia has gotten any larger.  Also complains of sharp pain on palpation to his right abdomen.  Patient in no apparent distress at this time.

## 2015-11-17 NOTE — Anesthesia Preprocedure Evaluation (Addendum)
Anesthesia Evaluation  Patient identified by MRN, date of birth, ID band Patient awake    Reviewed: Allergy & Precautions, NPO status , Patient's Chart, lab work & pertinent test results  Airway Mallampati: II  TM Distance: <3 FB Neck ROM: Full    Dental  (+) Teeth Intact, Dental Advisory Given   Pulmonary Current Smoker,    breath sounds clear to auscultation       Cardiovascular  Rhythm:Regular Rate:Normal     Neuro/Psych    GI/Hepatic   Endo/Other    Renal/GU      Musculoskeletal   Abdominal   Peds  Hematology   Anesthesia Other Findings   Reproductive/Obstetrics                            Anesthesia Physical Anesthesia Plan  ASA: II and emergent  Anesthesia Plan: General   Post-op Pain Management:    Induction: Intravenous, Rapid sequence and Cricoid pressure planned  Airway Management Planned: Oral ETT  Additional Equipment:   Intra-op Plan:   Post-operative Plan:   Informed Consent: I have reviewed the patients History and Physical, chart, labs and discussed the procedure including the risks, benefits and alternatives for the proposed anesthesia with the patient or authorized representative who has indicated his/her understanding and acceptance.   Dental advisory given  Plan Discussed with: CRNA and Anesthesiologist  Anesthesia Plan Comments:         Anesthesia Quick Evaluation

## 2015-11-17 NOTE — Anesthesia Postprocedure Evaluation (Signed)
Anesthesia Post Note  Patient: Dennis Richard  Procedure(s) Performed: Procedure(s) (LRB): APPENDECTOMY LAPAROSCOPIC (N/A)  Patient location during evaluation: PACU Anesthesia Type: General Level of consciousness: awake and awake and alert Pain management: pain level controlled Vital Signs Assessment: post-procedure vital signs reviewed and stable Respiratory status: spontaneous breathing Anesthetic complications: no    Last Vitals:  Filed Vitals:   11/17/15 1400 11/17/15 1500  BP: 118/73 101/55  Pulse: 85 84  Temp: 36.8 C 36.9 C  Resp: 14 16    Last Pain:  Filed Vitals:   11/17/15 1502  PainSc: Asleep                 Zygmund Passero COKER

## 2015-11-18 ENCOUNTER — Encounter (HOSPITAL_COMMUNITY): Payer: Self-pay | Admitting: General Surgery

## 2015-11-18 MED ORDER — HYDROCODONE-ACETAMINOPHEN 5-325 MG PO TABS: 1.0000 | ORAL_TABLET | ORAL | Status: DC | PRN

## 2015-11-18 NOTE — Discharge Instructions (Signed)

## 2015-11-18 NOTE — Discharge Summary (Signed)
Physician Discharge Summary  Patient ID: Dennis Richard MRN: [id number] DOB/AGE: [birth year] 49 y.o.  Admit date: 11/17/2015 Discharge date: 11/18/2015  Admitting Diagnosis: Acute appendicitis   Discharge Diagnosis Patient Active Problem List   Diagnosis Date Noted  . Acute appendicitis 11/17/2015    Consultants none  Imaging: Ct Abdomen Pelvis W Contrast  11/17/2015  CLINICAL DATA:  Diffuse abdominal pain starting yesterday p.m., nausea EXAM: CT ABDOMEN AND PELVIS WITH CONTRAST TECHNIQUE: Multidetector CT imaging of the abdomen and pelvis was performed using the standard protocol following bolus administration of intravenous contrast. CONTRAST:  126mL OMNIPAQUE IOHEXOL 300 MG/ML  SOLN COMPARISON:  None. FINDINGS: The lung bases are unremarkable. Sagittal images of the spine shows degenerative changes lumbar spine. Disc space flattening with anterior spurring at L3-L4 and L4-L5 level. Enhanced liver pancreas and adrenal glands are unremarkable. There is a lobulated splenic cyst measures 3.2 by 1.9 cm. No aortic aneurysm. Kidneys are symmetrical in size and enhancement. No hydronephrosis or hydroureter. Delayed renal images shows bilateral renal symmetrical excretion. Bilateral visualized proximal ureter is unremarkable. Moderate stool noted in right colon and transverse colon. There is nonspecific mild thickening of cecal wall. The terminal ileum is unremarkable. There is abnormal enhancement and thickening of the appendix. Stranding of surrounding fat. Small amount of surrounding fluid. The appendix is best visualized in axial image 68 measures 1 cm in diameter. Findings are consistent with acute appendicitis. Prostate gland and seminal vesicles are unremarkable. No inguinal adenopathy. The urinary bladder is under distended. Nonspecific mild thickening of urinary bladder wall. There is a umbilical hernia containing fat measures 2.6 x 2.4 cm without evidence of acute complication. IMPRESSION:  1. There is abnormal enhancement and thickening of the appendix. Small amount of periappendiceal fluid. Findings are consistent with acute appendicitis. No definite evidence of perforation. 2. Nonspecific mild thickening of cecal wall probable satellite inflammation. 3. No small bowel obstruction. 4. Unremarkable terminal ileum. 5. Moderate stool in right colon and transverse colon. 6. There is a umbilical hernia containing fat measures 2.6 x 2.4 cm. No evidence of acute complication. These results were called by telephone at the time of interpretation on 11/17/2015 at 9:12 am to Dr. Delrae Rend , who verbally acknowledged these results. Electronically Signed   By: Lahoma Crocker M.D.   On: 11/17/2015 09:12    Procedures Laparoscopic appendectomy---Dr. Darcella Gasman Course:  Dennis Richard is a 49 y/o white male with history of tobacco use, bilateral inguinal hernias s/p repair 1995, and unrepaired umbilical hernia who presented to [facility] for evaluation of abdominal pain CT showed acute appendicitis.  Patient was admitted and underwent procedure listed above.  Tolerated procedure well and was transferred to the floor.  Diet was advanced as tolerated.  On POD#1, the patient was voiding well, tolerating diet, ambulating well, pain well controlled, vital signs stable, incisions c/d/i and felt stable for discharge home.  Medication risks, benefits and therapeutic alternatives were reviewed with the patient.  He verbalizes understanding.  Patient will follow up in our office in 2 weeks and knows to call with questions or concerns.  Physical Exam: General:  Alert, NAD, pleasant, comfortable Abd:  Soft, ND, mild tenderness, incisions C/D/I    Medication List    TAKE these medications        Cinnamon 500 MG capsule  Take 500 mg by mouth daily.     co-enzyme Q-10 30 MG capsule  Take 30 mg by mouth 3 (three) times daily.  HYDROcodone-acetaminophen 5-325 MG tablet  Commonly known as:  NORCO/VICODIN   Take 1-2 tablets by mouth every 4 (four) hours as needed for moderate pain.     multivitamin with minerals Tabs tablet  Take 1 tablet by mouth daily.     saccharomyces boulardii 250 MG capsule  Commonly known as:  FLORASTOR  Take 750 mg by mouth daily.     TURMERIC PO  Take 1 capsule by mouth daily.             Follow-up Information    Follow up with LIEPINS, ANDY, PA-C On 12/02/2015.   Specialty:  Surgery   Why:  arrive by 10AM for a 10:30AM post operative check up at the office of [facility] surgery    Contact information:   [place] [address] [phone]       Signed: Erby Pian, [facility] Surgery [phone]  11/18/2015, 8:52 AM

## 2015-12-20 HISTORY — PX: COLONOSCOPY: SHX174

## 2016-01-10 IMAGING — CT CT ABD-PELV W/ CM
2 of 5 series · 15 of 46 positions shown, 17 images · IV contrast (Omni 300)
Comparison: None.

CLINICAL DATA: Diffuse abdominal pain starting yesterday p.m.,
nausea

EXAM:
CT ABDOMEN AND PELVIS WITH CONTRAST
TECHNIQUE: Multidetector CT imaging of the abdomen and pelvis was performed
using the standard protocol following bolus administration of
intravenous contrast.
CONTRAST:  100mL OMNIPAQUE IOHEXOL 300 MG/ML  SOLN

[Series 2: a/p w/ 5mm · axial · 0.78mm/px · z∈[-1101,-641]mm · 12 of 104 slices shown, 14 images]
[im 6/104  soft-tissue]
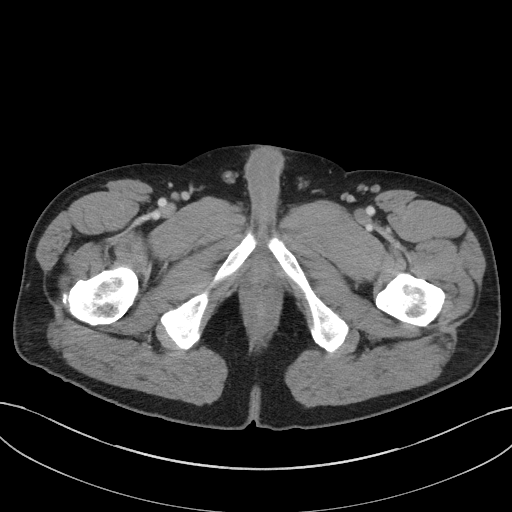
[im 6/104  bone]
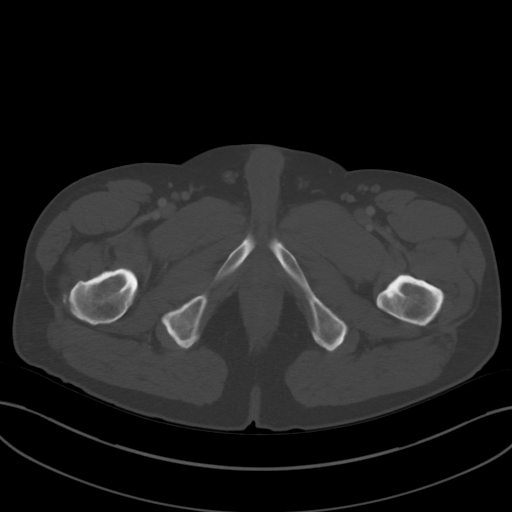
[im 18/104  soft-tissue]
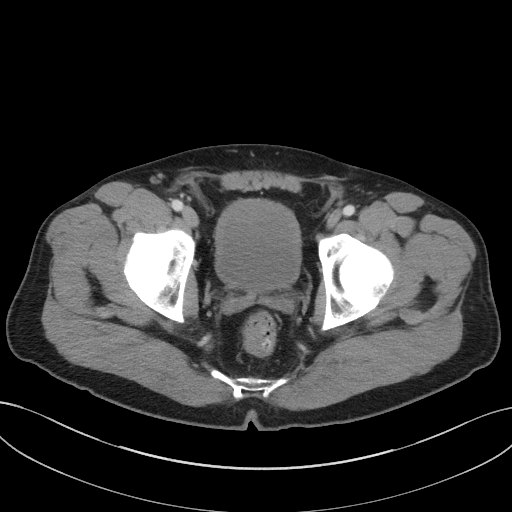
[im 23/104  soft-tissue]
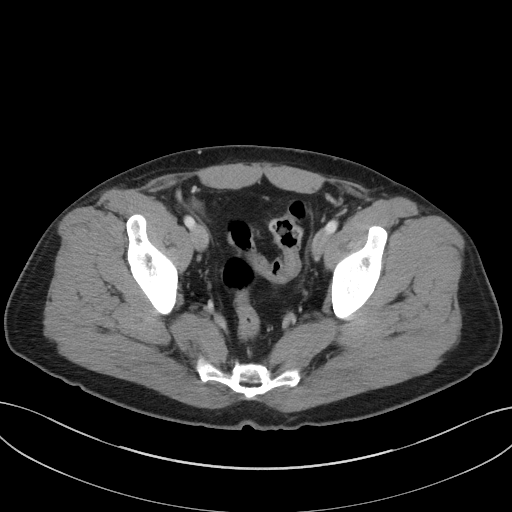
[im 29/104  soft-tissue]
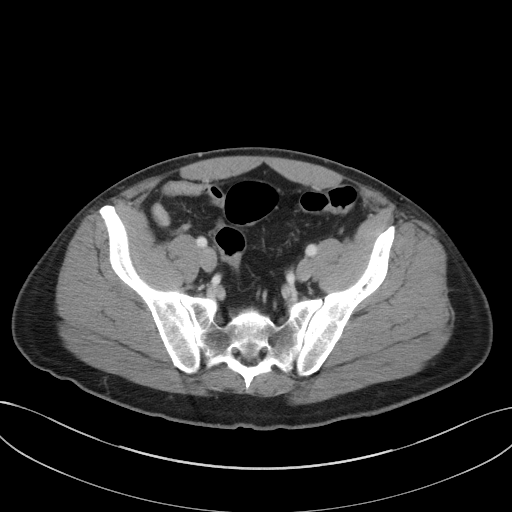
[im 41/104  soft-tissue]
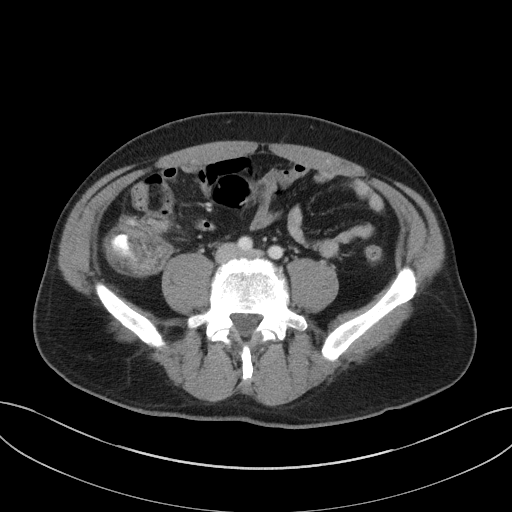
[im 46/104  soft-tissue]
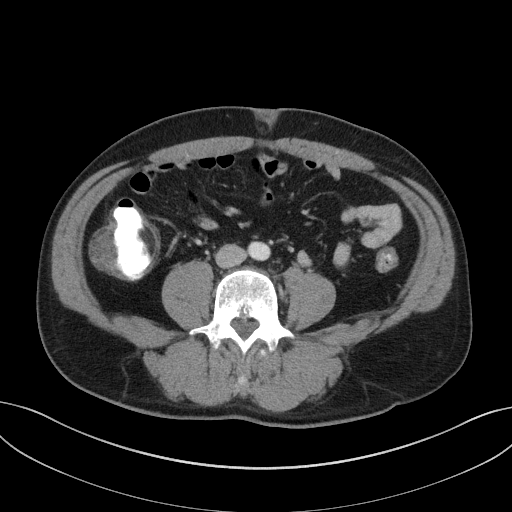
[im 58/104  soft-tissue]
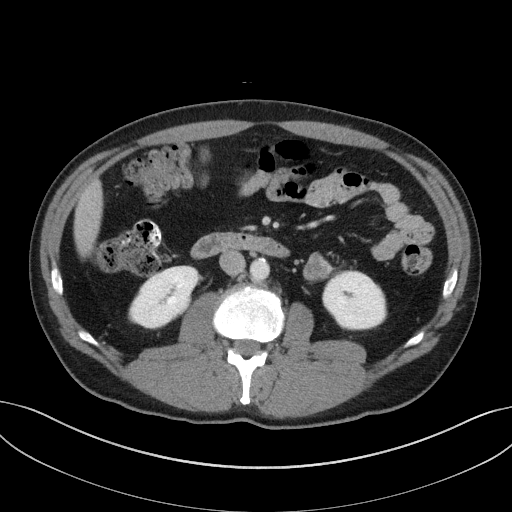
[im 63/104  soft-tissue]
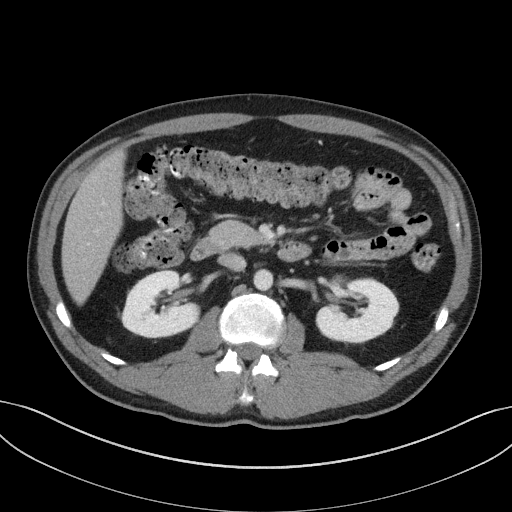
[im 75/104  soft-tissue]
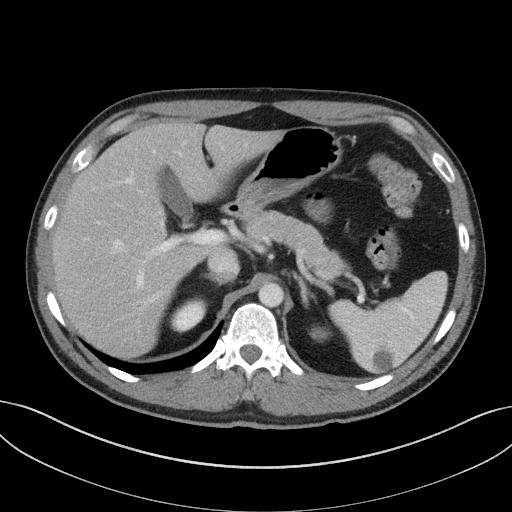
[im 75/104  bone]
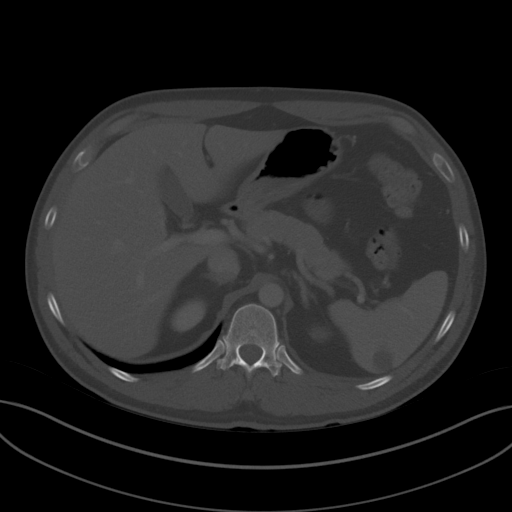
[im 81/104  soft-tissue]
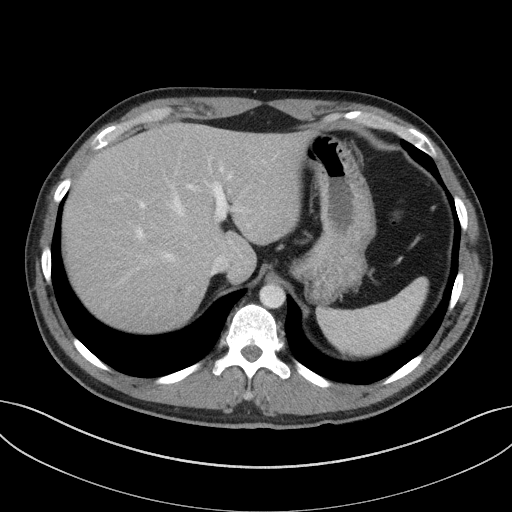
[im 86/104  soft-tissue]
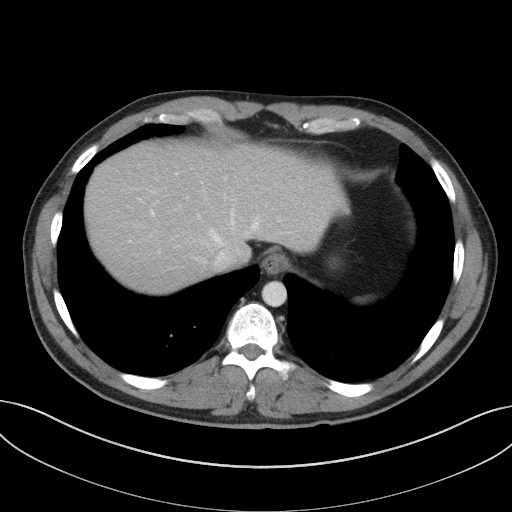
[im 98/104  soft-tissue]
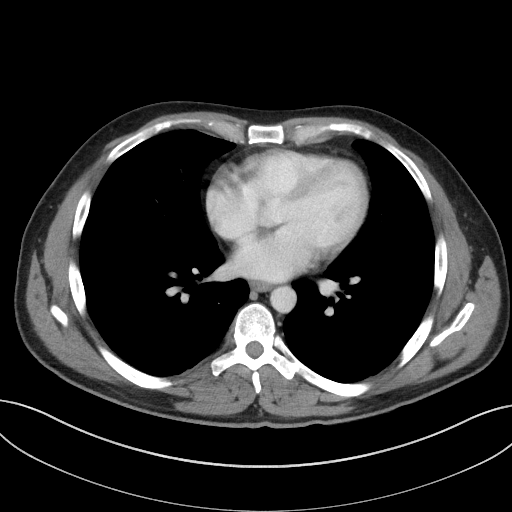

[Series 5: a/p w/ cor · coronal · 0.75mm/px · 3 of 118 slices shown]
[im 40/118  soft-tissue]
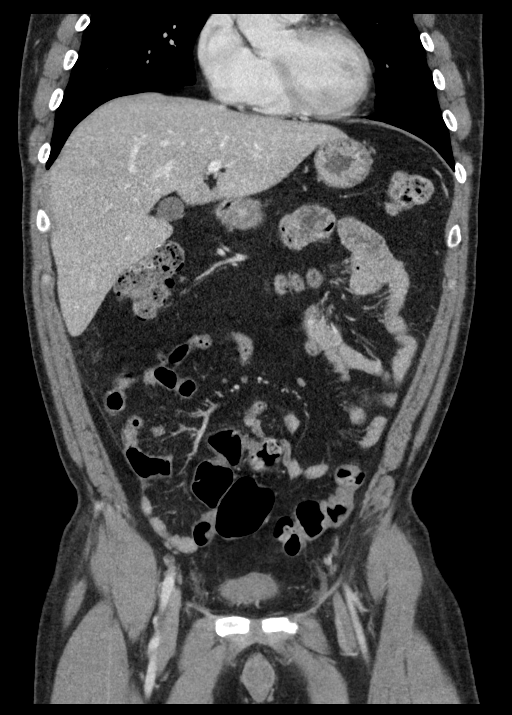
[im 53/118  soft-tissue]
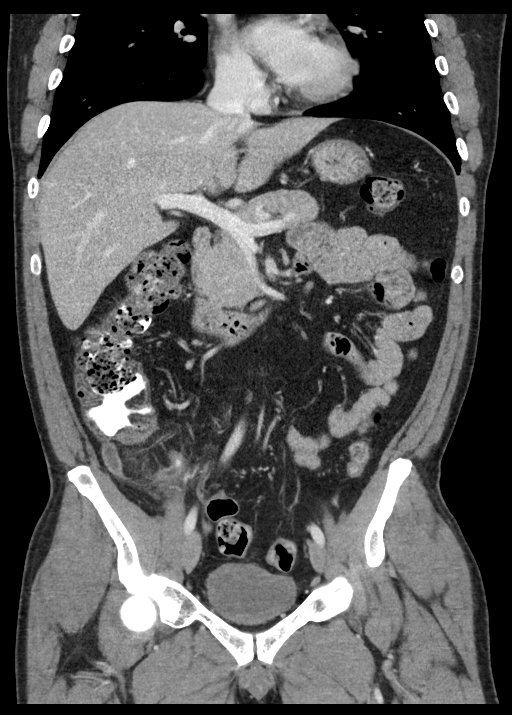
[im 66/118  soft-tissue]
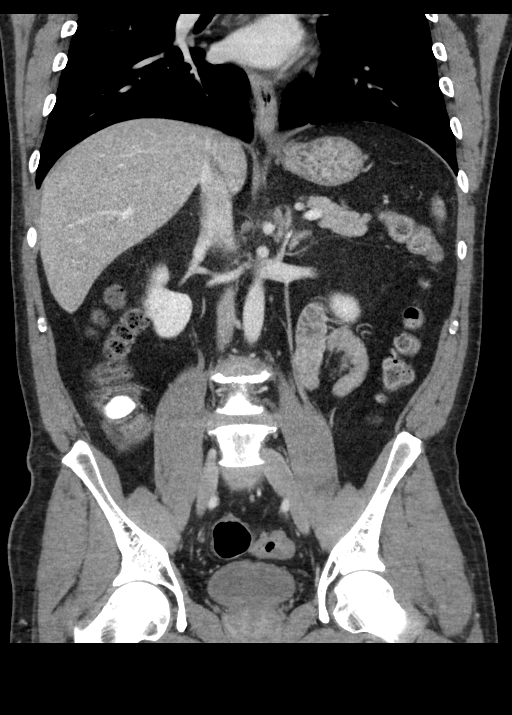

[15 of 46 positions shown; findings below may reference images not displayed]

FINDINGS: The lung bases are unremarkable. Sagittal images of the spine shows
degenerative changes lumbar spine. Disc space flattening with
anterior spurring at L3-L4 and L4-L5 level.

Enhanced liver pancreas and adrenal glands are unremarkable. There
is a lobulated splenic cyst measures 3.2 by 1.9 cm. No aortic
aneurysm.

Kidneys are symmetrical in size and enhancement. No hydronephrosis
or hydroureter. Delayed renal images shows bilateral renal
symmetrical excretion. Bilateral visualized proximal ureter is
unremarkable.

Moderate stool noted in right colon and transverse colon. There is
nonspecific mild thickening of cecal wall. The terminal ileum is
unremarkable. There is abnormal enhancement and thickening of the
appendix. Stranding of surrounding fat. Small amount of surrounding
fluid. The appendix is best visualized in axial image 68 measures 1
cm in diameter. Findings are consistent with acute appendicitis.

Prostate gland and seminal vesicles are unremarkable. No inguinal
adenopathy. The urinary bladder is under distended. Nonspecific mild
thickening of urinary bladder wall. There is a umbilical hernia
containing fat measures 2.6 x 2.4 cm without evidence of acute
complication.
IMPRESSION: 1. There is abnormal enhancement and thickening of the appendix.
Small amount of periappendiceal fluid. Findings are consistent with
acute appendicitis. No definite evidence of perforation.
2. Nonspecific mild thickening of cecal wall probable satellite
inflammation.
3. No small bowel obstruction.
4. Unremarkable terminal ileum.
5. Moderate stool in right colon and transverse colon.
6. There is a umbilical hernia containing fat measures 2.6 x 2.4 cm.
No evidence of acute complication.
These results were called by telephone at the time of interpretation
on 11/17/2015 at [DATE] to Dr. ALGAILI ESAA , who verbally
acknowledged these results.

## 2016-03-22 ENCOUNTER — Encounter: Payer: Self-pay | Admitting: Family

## 2016-03-22 ENCOUNTER — Ambulatory Visit (INDEPENDENT_AMBULATORY_CARE_PROVIDER_SITE_OTHER): Payer: PRIVATE HEALTH INSURANCE | Admitting: Family

## 2016-03-22 VITALS — BP 108/78 | HR 65 | Temp 98.4°F | Resp 16 | Ht 67.75 in | Wt 182.4 lb

## 2016-03-22 DIAGNOSIS — Z0000 Encounter for general adult medical examination without abnormal findings: Secondary | ICD-10-CM | POA: Insufficient documentation

## 2016-03-22 NOTE — Patient Instructions (Addendum)
Thank you for choosing [facility].  Summary/Instructions:  Please stop by the lab on the basement level of the building for your blood work. Your results will be released to [place] (or called to you) after review, usually within 72 hours after test completion. If any changes need to be made, you will be notified at that same time.  Lab is open Monday-Friday 7:30am - 5:30 pm. No appointment is needed.   Health Maintenance, Male A healthy lifestyle and preventative care can promote health and wellness.  Maintain regular health, dental, and eye exams.  Eat a healthy diet. Foods like vegetables, fruits, whole grains, low-fat dairy products, and lean protein foods contain the nutrients you need and are low in calories. Decrease your intake of foods high in solid fats, added sugars, and salt. Get information about a proper diet from your health care provider, if necessary.  Regular physical exercise is one of the most important things you can do for your health. Most adults should get at least 150 minutes of moderate-intensity exercise (any activity that increases your heart rate and causes you to sweat) each week. In addition, most adults need muscle-strengthening exercises on 2 or more days a week.   Maintain a healthy weight. The body mass index (BMI) is a screening tool to identify possible weight problems. It provides an estimate of body fat based on height and weight. Your health care provider can find your BMI and can help you achieve or maintain a healthy weight. For males 20 years and older:  A BMI below 18.5 is considered underweight.  A BMI of 18.5 to 24.9 is normal.  A BMI of 25 to 29.9 is considered overweight.  A BMI of 30 and above is considered obese.  Maintain normal blood lipids and cholesterol by exercising and minimizing your intake of saturated fat. Eat a balanced diet with plenty of fruits and vegetables. Blood tests for lipids and cholesterol should begin at age  59 and be repeated every 5 years. If your lipid or cholesterol levels are high, you are over age 70, or you are at high risk for heart disease, you may need your cholesterol levels checked more frequently.Ongoing high lipid and cholesterol levels should be treated with medicines if diet and exercise are not working.  If you smoke, find out from your health care provider how to quit. If you do not use tobacco, do not start.  Lung cancer screening is recommended for adults aged 108-80 years who are at high risk for developing lung cancer because of a history of smoking. A yearly low-dose CT scan of the lungs is recommended for people who have at least a 30-pack-year history of smoking and are current smokers or have quit within the past 15 years. A pack year of smoking is smoking an average of 1 pack of cigarettes a day for 1 year (for example, a 30-pack-year history of smoking could mean smoking 1 pack a day for 30 years or 2 packs a day for 15 years). Yearly screening should continue until the smoker has stopped smoking for at least 15 years. Yearly screening should be stopped for people who develop a health problem that would prevent them from having lung cancer treatment.  If you choose to drink alcohol, do not have more than 2 drinks per day. One drink is considered to be 12 oz (360 mL) of beer, 5 oz (150 mL) of wine, or 1.5 oz (45 mL) of liquor.  Avoid the use of  street drugs. Do not share needles with anyone. Ask for help if you need support or instructions about stopping the use of drugs.  High blood pressure causes heart disease and increases the risk of stroke. High blood pressure is more likely to develop in:  People who have blood pressure in the end of the normal range (100-139/85-89 mm Hg).  People who are overweight or obese.  People who are African American.  If you are 23-4 years of age, have your blood pressure checked every 3-5 years. If you are 11 years of age or older, have your  blood pressure checked every year. You should have your blood pressure measured twice--once when you are at a hospital or clinic, and once when you are not at a hospital or clinic. Record the average of the two measurements. To check your blood pressure when you are not at a hospital or clinic, you can use:  An automated blood pressure machine at a pharmacy.  A home blood pressure monitor.  If you are 20-64 years old, ask your health care provider if you should take aspirin to prevent heart disease.  Diabetes screening involves taking a blood sample to check your fasting blood sugar level. This should be done once every 3 years after age 94 if you are at a normal weight and without risk factors for diabetes. Testing should be considered at a younger age or be carried out more frequently if you are overweight and have at least 1 risk factor for diabetes.  Colorectal cancer can be detected and often prevented. Most routine colorectal cancer screening begins at the age of 42 and continues through age 69. However, your health care provider may recommend screening at an earlier age if you have risk factors for colon cancer. On a yearly basis, your health care provider may provide home test kits to check for hidden blood in the stool. A small camera at the end of a tube may be used to directly examine the colon (sigmoidoscopy or colonoscopy) to detect the earliest forms of colorectal cancer. Talk to your health care provider about this at age 71 when routine screening begins. A direct exam of the colon should be repeated every 5-10 years through age 82, unless early forms of precancerous polyps or small growths are found.  People who are at an increased risk for hepatitis B should be screened for this virus. You are considered at high risk for hepatitis B if:  You were born in a country where hepatitis B occurs often. Talk with your health care provider about which countries are considered high risk.  Your  parents were born in a high-risk country and you have not received a shot to protect against hepatitis B (hepatitis B vaccine).  You have HIV or AIDS.  You use needles to inject street drugs.  You live with, or have sex with, someone who has hepatitis B.  You are a man who has sex with other men (MSM).  You get hemodialysis treatment.  You take certain medicines for conditions like cancer, organ transplantation, and autoimmune conditions.  Hepatitis C blood testing is recommended for all people born from [birth year] through 1965 and any individual with known risk factors for hepatitis C.  Healthy men should no longer receive prostate-specific antigen (PSA) blood tests as part of routine cancer screening. Talk to your health care provider about prostate cancer screening.  Testicular cancer screening is not recommended for adolescents or adult males who have no symptoms. Screening  includes self-exam, a health care provider exam, and other screening tests. Consult with your health care provider about any symptoms you have or any concerns you have about testicular cancer.  Practice safe sex. Use condoms and avoid high-risk sexual practices to reduce the spread of sexually transmitted infections (STIs).  You should be screened for STIs, including gonorrhea and chlamydia if:  You are sexually active and are younger than 24 years.  You are older than 24 years, and your health care provider tells you that you are at risk for this type of infection.  Your sexual activity has changed since you were last screened, and you are at an increased risk for chlamydia or gonorrhea. Ask your health care provider if you are at risk.  If you are at risk of being infected with HIV, it is recommended that you take a prescription medicine daily to prevent HIV infection. This is called pre-exposure prophylaxis (PrEP). You are considered at risk if:  You are a man who has sex with other men (MSM).  You are a  heterosexual man who is sexually active with multiple partners.  You take drugs by injection.  You are sexually active with a partner who has HIV.  Talk with your health care provider about whether you are at high risk of being infected with HIV. If you choose to begin PrEP, you should first be tested for HIV. You should then be tested every 3 months for as long as you are taking PrEP.  Use sunscreen. Apply sunscreen liberally and repeatedly throughout the day. You should seek shade when your shadow is shorter than you. Protect yourself by wearing long sleeves, pants, a wide-brimmed hat, and sunglasses year round whenever you are outdoors.  Tell your health care provider of new moles or changes in moles, especially if there is a change in shape or color. Also, tell your health care provider if a mole is larger than the size of a pencil eraser.  A one-time screening for abdominal aortic aneurysm (AAA) and surgical repair of large AAAs by ultrasound is recommended for men aged 39-75 years who are current or former smokers.  Stay current with your vaccines (immunizations).   This information is not intended to replace advice given to you by your health care provider. Make sure you discuss any questions you have with your health care provider.   Document Released: 06/02/2008 Document Revised: 12/26/2014 Document Reviewed: 05/02/2011 Elsevier Interactive Patient Education 2016 [employer].   Smoking Hazards Smoking cigarettes is extremely bad for your health. Tobacco smoke has over 200 known poisons in it. It contains the poisonous gases nitrogen oxide and carbon monoxide. There are over 60 chemicals in tobacco smoke that cause cancer. Some of the chemicals found in cigarette smoke include:   Cyanide.   Benzene.   Formaldehyde.   Methanol (wood alcohol).   Acetylene (fuel used in welding torches).   Ammonia.  Even smoking lightly shortens your life expectancy by several years.  You can greatly reduce the risk of medical problems for you and your family by stopping now. Smoking is the most preventable cause of death and disease in our society. Within days of quitting smoking, your circulation improves, you decrease the risk of having a heart attack, and your lung capacity improves. There may be some increased phlegm in the first few days after quitting, and it may take months for your lungs to clear up completely. Quitting for 10 years reduces your risk of developing lung cancer  to almost that of a nonsmoker.  WHAT ARE THE RISKS OF SMOKING? Cigarette smokers have an increased risk of many serious medical problems, including:  Lung cancer.   Lung disease (such as pneumonia, bronchitis, and emphysema).   Heart attack and chest pain due to the heart not getting enough oxygen (angina).   Heart disease and peripheral blood vessel disease.   Hypertension.   Stroke.   Oral cancer (cancer of the lip, mouth, or voice box).   Bladder cancer.   Pancreatic cancer.   Cervical cancer.   Pregnancy complications, including premature birth.   Stillbirths and smaller newborn babies, birth defects, and genetic damage to sperm.   Early menopause.   Lower estrogen level for women.   Infertility.   Facial wrinkles.   Blindness.   Increased risk of broken bones (fractures).   Senile dementia.   Stomach ulcers and internal bleeding.   Delayed wound healing and increased risk of complications during surgery. Because of secondhand smoke exposure, children of smokers have an increased risk of the following:   Sudden infant death syndrome (SIDS).   Respiratory infections.   Lung cancer.   Heart disease.   Ear infections.  WHY IS SMOKING ADDICTIVE? Nicotine is the chemical agent in tobacco that is capable of causing addiction or dependence. When you smoke and inhale, nicotine is absorbed rapidly into the bloodstream through your lungs.  Both inhaled and noninhaled nicotine may be addictive.  WHAT ARE THE BENEFITS OF QUITTING?  There are many health benefits to quitting smoking. Some are:   The likelihood of developing cancer and heart disease decreases. Health improvements are seen almost immediately.   Blood pressure, pulse rate, and breathing patterns start returning to normal soon after quitting.   People who quit may see an improvement in their overall quality of life.  HOW DO YOU QUIT SMOKING? Smoking is an addiction with both physical and psychological effects, and longtime habits can be hard to change. Your health care provider can recommend:  Programs and community resources, which may include group support, education, or therapy.  Replacement products, such as patches, gum, and nasal sprays. Use these products only as directed. Do not replace cigarette smoking with electronic cigarettes (commonly called e-cigarettes). The safety of e-cigarettes is unknown, and some may contain harmful chemicals. FOR MORE INFORMATION  American Lung Association: www.lung.org  American Cancer Society: www.cancer.org   This information is not intended to replace advice given to you by your health care provider. Make sure you discuss any questions you have with your health care provider.   Document Released: 01/12/2005 Document Revised: 09/25/2013 Document Reviewed: 05/27/2013 Elsevier Interactive Patient Education 2016 [employer].  Steps to Quit Smoking  Smoking tobacco can be harmful to your health and can affect almost every organ in your body. Smoking puts you, and those around you, at risk for developing many serious chronic diseases. Quitting smoking is difficult, but it is one of the best things that you can do for your health. It is never too late to quit. WHAT ARE THE BENEFITS OF QUITTING SMOKING? When you quit smoking, you lower your risk of developing serious diseases and conditions, such as:  Lung cancer or lung  disease, such as COPD.  Heart disease.  Stroke.  Heart attack.  Infertility.  Osteoporosis and bone fractures. Additionally, symptoms such as coughing, wheezing, and shortness of breath may get better when you quit. You may also find that you get sick less often because your body is  stronger at fighting off colds and infections. If you are pregnant, quitting smoking can help to reduce your chances of having a baby of low birth weight. HOW DO I GET READY TO QUIT? When you decide to quit smoking, create a plan to make sure that you are successful. Before you quit:  Pick a date to quit. Set a date within the next two weeks to give you time to prepare.  Write down the reasons why you are quitting. Keep this list in places where you will see it often, such as on your bathroom mirror or in your car or wallet.  Identify the people, places, things, and activities that make you want to smoke (triggers) and avoid them. Make sure to take these actions:  Throw away all cigarettes at home, at work, and in your car.  Throw away smoking accessories, such as Scientist, research (medical).  Clean your car and make sure to empty the ashtray.  Clean your home, including curtains and carpets.  Tell your family, friends, and coworkers that you are quitting. Support from your loved ones can make quitting easier.  Talk with your health care provider about your options for quitting smoking.  Find out what treatment options are covered by your health insurance. WHAT STRATEGIES CAN I USE TO QUIT SMOKING?  Talk with your healthcare provider about different strategies to quit smoking. Some strategies include:  Quitting smoking altogether instead of gradually lessening how much you smoke over a period of time. Research shows that quitting "cold [place]" is more successful than gradually quitting.  Attending in-person counseling to help you build problem-solving skills. You are more likely to have success in  quitting if you attend several counseling sessions. Even short sessions of 10 minutes can be effective.  Finding resources and support systems that can help you to quit smoking and remain smoke-free after you quit. These resources are most helpful when you use them often. They can include:  Online chats with a Social worker.  Telephone quitlines.  Printed Furniture conservator/restorer.  Support groups or group counseling.  Text messaging programs.  Mobile phone applications.  Taking medicines to help you quit smoking. (If you are pregnant or breastfeeding, talk with your health care provider first.) Some medicines contain nicotine and some do not. Both types of medicines help with cravings, but the medicines that include nicotine help to relieve withdrawal symptoms. Your health care provider may recommend:  Nicotine patches, gum, or lozenges.  Nicotine inhalers or sprays.  Non-nicotine medicine that is taken by mouth. Talk with your health care provider about combining strategies, such as taking medicines while you are also receiving in-person counseling. Using these two strategies together makes you more likely to succeed in quitting than if you used either strategy on its own. If you are pregnant or breastfeeding, talk with your health care provider about finding counseling or other support strategies to quit smoking. Do not take medicine to help you quit smoking unless told to do so by your health care provider. WHAT THINGS CAN I DO TO MAKE IT EASIER TO QUIT? Quitting smoking might feel overwhelming at first, but there is a lot that you can do to make it easier. Take these important actions:  Reach out to your family and friends and ask that they support and encourage you during this time. Call telephone quitlines, reach out to support groups, or work with a counselor for support.  Ask people who smoke to avoid smoking around you.  Avoid places that  trigger you to smoke, such as bars, parties,  or smoke-break areas at work.  Spend time around people who do not smoke.  Lessen stress in your life, because stress can be a smoking trigger for some people. To lessen stress, try:  Exercising regularly.  Deep-breathing exercises.  Yoga.  Meditating.  Performing a body scan. This involves closing your eyes, scanning your body from head to toe, and noticing which parts of your body are particularly tense. Purposefully relax the muscles in those areas.  Download or purchase mobile phone or tablet apps (applications) that can help you stick to your quit plan by providing reminders, tips, and encouragement. There are many free apps, such as QuitGuide from the [employer] Office manager for Disease Control and Prevention). You can find other support for quitting smoking (smoking cessation) through smokefree.gov and other websites. HOW WILL I FEEL WHEN I QUIT SMOKING? Within the first 24 hours of quitting smoking, you may start to feel some withdrawal symptoms. These symptoms are usually most noticeable 2-3 days after quitting, but they usually do not last beyond 2-3 weeks. Changes or symptoms that you might experience include:  Mood swings.  Restlessness, anxiety, or irritation.  Difficulty concentrating.  Dizziness.  Strong cravings for sugary foods in addition to nicotine.  Mild weight gain.  Constipation.  Nausea.  Coughing or a sore throat.  Changes in how your medicines work in your body.  A depressed mood.  Difficulty sleeping (insomnia). After the first 2-3 weeks of quitting, you may start to notice more positive results, such as:  Improved sense of smell and taste.  Decreased coughing and sore throat.  Slower heart rate.  Lower blood pressure.  Clearer skin.  The ability to breathe more easily.  Fewer sick days. Quitting smoking is very challenging for most people. Do not get discouraged if you are not successful the first time. Some people need to make many attempts  to quit before they achieve long-term success. Do your best to stick to your quit plan, and talk with your health care provider if you have any questions or concerns.   This information is not intended to replace advice given to you by your health care provider. Make sure you discuss any questions you have with your health care provider.   Document Released: 11/29/2001 Document Revised: 04/21/2015 Document Reviewed: 04/21/2015 Elsevier Interactive Patient Education 2016 [employer].  Smoking Cessation, Tips for Success If you are ready to quit smoking, congratulations! You have chosen to help yourself be healthier. Cigarettes bring nicotine, tar, carbon monoxide, and other irritants into your body. Your lungs, heart, and blood vessels will be able to work better without these poisons. There are many different ways to quit smoking. Nicotine gum, nicotine patches, a nicotine inhaler, or nicotine nasal spray can help with physical craving. Hypnosis, support groups, and medicines help break the habit of smoking. WHAT THINGS CAN I DO TO MAKE QUITTING EASIER?  Here are some tips to help you quit for good:  Pick a date when you will quit smoking completely. Tell all of your friends and family about your plan to quit on that date.  Do not try to slowly cut down on the number of cigarettes you are smoking. Pick a quit date and quit smoking completely starting on that day.  Throw away all cigarettes.   Clean and remove all ashtrays from your home, work, and car.  On a card, write down your reasons for quitting. Carry the card with you and  read it when you get the urge to smoke.  Cleanse your body of nicotine. Drink enough water and fluids to keep your urine clear or pale yellow. Do this after quitting to flush the nicotine from your body.  Learn to predict your moods. Do not let a bad situation be your excuse to have a cigarette. Some situations in your life might tempt you into wanting a  cigarette.  Never have "just one" cigarette. It leads to wanting another and another. Remind yourself of your decision to quit.  Change habits associated with smoking. If you smoked while driving or when feeling stressed, try other activities to replace smoking. Stand up when drinking your coffee. Brush your teeth after eating. Sit in a different chair when you read the paper. Avoid alcohol while trying to quit, and try to drink fewer caffeinated beverages. Alcohol and caffeine may urge you to smoke.  Avoid foods and drinks that can trigger a desire to smoke, such as sugary or spicy foods and alcohol.  Ask people who smoke not to smoke around you.  Have something planned to do right after eating or having a cup of coffee. For example, plan to take a walk or exercise.  Try a relaxation exercise to calm you down and decrease your stress. Remember, you may be tense and nervous for the first 2 weeks after you quit, but this will pass.  Find new activities to keep your hands busy. Play with a pen, coin, or rubber band. Doodle or draw things on paper.  Brush your teeth right after eating. This will help cut down on the craving for the taste of tobacco after meals. You can also try mouthwash.   Use oral substitutes in place of cigarettes. Try using lemon drops, carrots, cinnamon sticks, or chewing gum. Keep them handy so they are available when you have the urge to smoke.  When you have the urge to smoke, try deep breathing.  Designate your home as a nonsmoking area.  If you are a heavy smoker, ask your health care provider about a prescription for nicotine chewing gum. It can ease your withdrawal from nicotine.  Reward yourself. Set aside the cigarette money you save and buy yourself something nice.  Look for support from others. Join a support group or smoking cessation program. Ask someone at home or at work to help you with your plan to quit smoking.  Always ask yourself, "Do I need this  cigarette or is this just a reflex?" Tell yourself, "Today, I choose not to smoke," or "I do not want to smoke." You are reminding yourself of your decision to quit.  Do not replace cigarette smoking with electronic cigarettes (commonly called e-cigarettes). The safety of e-cigarettes is unknown, and some may contain harmful chemicals.  If you relapse, do not give up! Plan ahead and think about what you will do the next time you get the urge to smoke. HOW WILL I FEEL WHEN I QUIT SMOKING? You may have symptoms of withdrawal because your body is used to nicotine (the addictive substance in cigarettes). You may crave cigarettes, be irritable, feel very hungry, cough often, get headaches, or have difficulty concentrating. The withdrawal symptoms are only temporary. They are strongest when you first quit but will go away within 10-14 days. When withdrawal symptoms occur, stay in control. Think about your reasons for quitting. Remind yourself that these are signs that your body is healing and getting used to being without cigarettes. Remember that withdrawal  symptoms are easier to treat than the major diseases that smoking can cause.  Even after the withdrawal is over, expect periodic urges to smoke. However, these cravings are generally short lived and will go away whether you smoke or not. Do not smoke! WHAT RESOURCES ARE AVAILABLE TO HELP ME QUIT SMOKING? Your health care provider can direct you to community resources or hospitals for support, which may include:  Group support.  Education.  Hypnosis.  Therapy.   This information is not intended to replace advice given to you by your health care provider. Make sure you discuss any questions you have with your health care provider.   Document Released: 09/02/2004 Document Revised: 12/26/2014 Document Reviewed: 05/23/2013 Elsevier Interactive Patient Education [employer].

## 2016-03-22 NOTE — Progress Notes (Signed)
Pre visit review using our clinic review tool, if applicable. No additional management support is needed unless otherwise documented below in the visit note. 

## 2016-03-22 NOTE — Assessment & Plan Note (Addendum)
1) Anticipatory Guidance: Discussed importance of wearing a seatbelt while driving and not texting while driving; changing batteries in smoke detector at least once annually; wearing suntan lotion when outside; eating a balanced and moderate diet; getting physical activity at least 30 minutes per day.  2) Immunizations / Screenings / Labs:  Declines tetanus. All other immunizations are up-to-date per recommendations. Due for a colon cancer screening with referral to gastroenterology placed. All other screenings are up-to-date per recommendations. Obtain CBC, CMET, Lipid profile and TSH.   Overall well exam with risk factors for cardiovascular disease including tobacco use and a BMI indicating overweight. Recommend weight loss of approximately 5% of current body weight through lifestyle changes.Recommend increasing physical activity to 30 minutes of moderate level activity daily. Encourage nutritional intake that focuses on nutrient dense foods and is moderate, varied, and balanced and is low in saturated fats and processed/sugary foods. Discussed risk for cardiovascular and respiratory chronic conditions with continued tobacco use. Patient is not ready to quit smoking at this time. Information provided and after visit summary and encouraged to follow-up upon completion of reading material. Continue other healthy lifestyle behaviors and choices. Follow-up prevention exam in 1 year. Follow-up office visit pending blood work as needed.

## 2016-03-22 NOTE — Progress Notes (Signed)
Subjective:    Patient ID: Dennis Richard, male    DOB: April 04, 1966, 50 y.o.   MRN: [id number]  Chief Complaint  Patient presents with  . Establish Care    CPE    HPI:  Dennis Richard is a 50 y.o. male who presents today for an annual wellness visit.   1) Health Maintenance -   Diet - Averages about 2-3 meals per day consisting of fruits, vegetables, chicken, beef, pork  Exercise - No structured exercise   2) Preventative Exams / Immunizations:  Dental -- Up to date  Vision -- Up to date   Health Maintenance  Topic Date Due  . HIV Screening  06/11/1981  . TETANUS/TDAP  06/11/1985  . INFLUENZA VACCINE  07/19/2016     There is no immunization history on file for this patient.  No Known Allergies   Outpatient Prescriptions Prior to Visit  Medication Sig Dispense Refill  . Cinnamon 500 MG capsule Take 500 mg by mouth daily.    Marland Kitchen co-enzyme Q-10 30 MG capsule Take 30 mg by mouth 3 (three) times daily.    Marland Kitchen HYDROcodone-acetaminophen (NORCO/VICODIN) 5-325 MG tablet Take 1-2 tablets by mouth every 4 (four) hours as needed for moderate pain. 40 tablet 0  . Multiple Vitamin (MULTIVITAMIN WITH MINERALS) TABS tablet Take 1 tablet by mouth daily.    Marland Kitchen saccharomyces boulardii (FLORASTOR) 250 MG capsule Take 750 mg by mouth daily.    . TURMERIC PO Take 1 capsule by mouth daily.     No facility-administered medications prior to visit.     Past Medical History  Diagnosis Date  . Umbilical hernia   . Chicken pox   . GERD (gastroesophageal reflux disease)      Past Surgical History  Procedure Laterality Date  . Inguinal hernia repair Bilateral [place]  . Achilles tendon repair Right 2009    achilles rupture and repair  . Appendectomy  11/17/2015  . Laparoscopic appendectomy N/A 11/17/2015    Procedure: APPENDECTOMY LAPAROSCOPIC;  Surgeon: Ralene Ok, MD;  Location[facility];  Service: General;  Laterality: N/A;  . Tonsillectomy       Family  History  Problem Relation Age of Onset  . Hyperlipidemia Mother   . Hypertension Mother   . Hyperlipidemia Father   . Diabetes Father   . Heart disease Maternal Grandmother   . Heart disease Paternal Grandmother   . Diabetes Paternal Grandmother      Social History   Social History  . Marital Status: Married    Spouse Name: N/A  . Number of Children: 2  . Years of Education: 14   Occupational History  . [employer]     Social History Main Topics  . Smoking status: Current Every Day Smoker -- 0.50 packs/day for 32 years    Types: Cigarettes  . Smokeless tobacco: Former Systems developer    Types: Snuff, Chew  . Alcohol Use: 10.8 oz/week    6 Glasses of wine, 12 Cans of beer, 0 Standard drinks or equivalent per week  . Drug Use: No  . Sexual Activity: Yes   Other Topics Concern  . Not on file   Social History Narrative   Fun: Play music and ride 4 wheelers      Review of Systems  Constitutional: Denies fever, chills, fatigue, or significant weight gain/loss. HENT: Head: Denies headache or neck pain Ears: Denies changes in hearing, ringing in ears, earache, drainage Nose: Denies discharge,  stuffiness, itching, nosebleed, sinus pain Throat: Denies sore throat, hoarseness, dry mouth, sores, thrush Eyes: Denies loss/changes in vision, pain, redness, blurry/double vision, flashing lights Cardiovascular: Denies chest pain/discomfort, tightness, palpitations, shortness of breath with activity, difficulty lying down, swelling, sudden awakening with shortness of breath Respiratory: Denies shortness of breath, cough, sputum production, wheezing Gastrointestinal: Denies dysphasia, heartburn, change in appetite, nausea, change in bowel habits, rectal bleeding, constipation, diarrhea, yellow skin or eyes Genitourinary: Denies frequency, urgency, burning/pain, blood in urine, incontinence, change in urinary strength. Musculoskeletal: Denies muscle/joint pain, stiffness, back pain, redness  or swelling of joints, trauma Skin: Denies rashes, lumps, itching, dryness, color changes, or hair/nail changes Neurological: Denies dizziness, fainting, seizures, weakness, numbness, tingling, tremor Psychiatric - Denies nervousness, stress, depression or memory loss Endocrine: Denies heat or cold intolerance, sweating, frequent urination, excessive thirst, changes in appetite Hematologic: Denies ease of bruising or bleeding     Objective:     BP 108/78 mmHg  Pulse 65  Temp(Src) 98.4 F (36.9 C) (Oral)  Resp 16  Ht 5' 7.75" (1.721 m)  Wt 182 lb 6.4 oz (82.736 kg)  BMI 27.93 kg/m2  SpO2 97% Nursing note and vital signs reviewed.  Physical Exam  Constitutional: He is oriented to person, place, and time. He appears well-developed and well-nourished.  HENT:  Head: Normocephalic.  Right Ear: Hearing, tympanic membrane, external ear and ear canal normal.  Left Ear: Hearing, tympanic membrane, external ear and ear canal normal.  Nose: Nose normal.  Mouth/Throat: Uvula is midline, oropharynx is clear and moist and mucous membranes are normal.  Eyes: Conjunctivae and EOM are normal. Pupils are equal, round, and reactive to light.  Neck: Neck supple. No JVD present. No tracheal deviation present. No thyromegaly present.  Cardiovascular: Normal rate, regular rhythm, normal heart sounds and intact distal pulses.   Pulmonary/Chest: Effort normal and breath sounds normal.  Abdominal: Soft. Bowel sounds are normal. He exhibits no distension and no mass. There is no tenderness. There is no rebound and no guarding.  Musculoskeletal: Normal range of motion. He exhibits no edema or tenderness.  Lymphadenopathy:    He has no cervical adenopathy.  Neurological: He is alert and oriented to person, place, and time. He has normal reflexes. No cranial nerve deficit. He exhibits normal muscle tone. Coordination normal.  Skin: Skin is warm and dry.  Psychiatric: He has a normal mood and affect. His  behavior is normal. Judgment and thought content normal.       Assessment & Plan:   Problem List Items Addressed This Visit      Other   Routine general medical examination at a health care facility - Primary    1) Anticipatory Guidance: Discussed importance of wearing a seatbelt while driving and not texting while driving; changing batteries in smoke detector at least once annually; wearing suntan lotion when outside; eating a balanced and moderate diet; getting physical activity at least 30 minutes per day.  2) Immunizations / Screenings / Labs:  Declines tetanus. All other immunizations are up-to-date per recommendations. Due for a colon cancer screening with referral to gastroenterology placed. All other screenings are up-to-date per recommendations. Obtain CBC, CMET, Lipid profile and TSH.   Overall well exam with risk factors for cardiovascular disease including tobacco use and a BMI indicating overweight. Recommend weight loss of approximately 5% of current body weight through lifestyle changes.Recommend increasing physical activity to 30 minutes of moderate level activity daily. Encourage nutritional intake that focuses on nutrient dense foods and  is moderate, varied, and balanced and is low in saturated fats and processed/sugary foods. Discussed risk for cardiovascular and respiratory chronic conditions with continued tobacco use. Patient is not ready to quit smoking at this time. Information provided and after visit summary and encouraged to follow-up upon completion of reading material. Continue other healthy lifestyle behaviors and choices. Follow-up prevention exam in 1 year. Follow-up office visit pending blood work as needed.      Relevant Orders   Comprehensive metabolic panel   CBC   Lipid panel   TSH   Ambulatory referral to Gastroenterology

## 2016-04-05 ENCOUNTER — Telehealth: Payer: Self-pay | Admitting: Family

## 2016-04-05 ENCOUNTER — Other Ambulatory Visit (INDEPENDENT_AMBULATORY_CARE_PROVIDER_SITE_OTHER): Payer: PRIVATE HEALTH INSURANCE

## 2016-04-05 DIAGNOSIS — R7989 Other specified abnormal findings of blood chemistry: Secondary | ICD-10-CM | POA: Diagnosis not present

## 2016-04-05 DIAGNOSIS — Z0000 Encounter for general adult medical examination without abnormal findings: Secondary | ICD-10-CM

## 2016-04-05 LAB — COMPREHENSIVE METABOLIC PANEL
ALBUMIN: 4.5 g/dL (ref 3.5–5.2)
ALT: 10 U/L (ref 0–53)
AST: 18 U/L (ref 0–37)
Alkaline Phosphatase: 50 U/L (ref 39–117)
BUN: 10 mg/dL (ref 6–23)
CALCIUM: 9.6 mg/dL (ref 8.4–10.5)
CHLORIDE: 104 meq/L (ref 96–112)
CO2: 28 meq/L (ref 19–32)
Creatinine, Ser: 0.94 mg/dL (ref 0.40–1.50)
GFR: 90.36 mL/min (ref >60.00)
Glucose, Bld: 99 mg/dL (ref 70–99)
POTASSIUM: 4.2 meq/L (ref 3.5–5.1)
SODIUM: 138 meq/L (ref 135–145)
Total Bilirubin: 0.5 mg/dL (ref 0.2–1.2)
Total Protein: 7 g/dL (ref 6.0–8.3)

## 2016-04-05 LAB — CBC
HEMATOCRIT: 44.5 % (ref 39.0–52.0)
HEMOGLOBIN: 15.7 g/dL (ref 13.0–17.0)
MCHC: 35.2 g/dL (ref 30.0–36.0)
MCV: 95.9 fl (ref 78.0–100.0)
Platelets: 227 10*3/uL (ref 150.0–400.0)
RBC: 4.64 Mil/uL (ref 4.22–5.81)
RDW: 12.1 % (ref 11.5–15.5)
WBC: 7.4 10*3/uL (ref 4.0–10.5)

## 2016-04-05 LAB — LIPID PANEL
CHOL/HDL RATIO: 5
Cholesterol: 244 mg/dL — ABNORMAL HIGH (ref 0–200)
HDL: 47.4 mg/dL (ref >39.00)
NONHDL: 196.28
TRIGLYCERIDES: 241 mg/dL — AB (ref 0.0–149.0)
VLDL: 48.2 mg/dL — ABNORMAL HIGH (ref 0.0–40.0)

## 2016-04-05 LAB — LDL CHOLESTEROL, DIRECT: LDL DIRECT: 143 mg/dL

## 2016-04-05 LAB — TSH: TSH: 0.92 u[IU]/mL (ref 0.35–4.50)

## 2016-04-05 NOTE — Telephone Encounter (Signed)
Please inform patient that his blood work shows that his thyroid function, kidney function, liver function, electrolytes, and white/red blood cells are all within the normal ranges. His cholesterol shows that his triglycerides or fats circulating in his blood is elevated at 241 with a goal less than 150. His LDL or bad cholesterol is 143 with a goal less than 100. Patient has risk factor for coronary artery disease in the next 10 years, medication is not needed at this time, however I would recommend a change in lifestyle management including improving nutrition and consuming a diet that is moderate, varied, and balanced and low in saturated fats and processed/sugary foods. If he has any questions or concerns please let us know. He can plan to follow-up for physical in one year or sooner if needed.

## 2016-04-06 NOTE — Telephone Encounter (Signed)
Pt aware of results 

## 2016-07-26 ENCOUNTER — Encounter: Payer: Self-pay | Admitting: Gastroenterology

## 2016-09-26 ENCOUNTER — Ambulatory Visit (AMBULATORY_SURGERY_CENTER): Payer: Self-pay | Admitting: *Deleted

## 2016-09-26 VITALS — Ht 67.5 in | Wt 191.0 lb

## 2016-09-26 DIAGNOSIS — Z1211 Encounter for screening for malignant neoplasm of colon: Secondary | ICD-10-CM

## 2016-09-26 MED ORDER — NA SULFATE-K SULFATE-MG SULF 17.5-3.13-1.6 GM/177ML PO SOLN: 1.0000 | Freq: Once | ORAL | 0 refills | Status: AC

## 2016-09-26 NOTE — Progress Notes (Signed)
No egg or soy allergy. No anesthesia problems.  No home O2.  No diet meds.  

## 2016-10-04 ENCOUNTER — Encounter: Payer: Self-pay | Admitting: Gastroenterology

## 2016-10-17 ENCOUNTER — Ambulatory Visit (AMBULATORY_SURGERY_CENTER): Payer: PRIVATE HEALTH INSURANCE | Admitting: Gastroenterology

## 2016-10-17 ENCOUNTER — Encounter: Payer: Self-pay | Admitting: Gastroenterology

## 2016-10-17 VITALS — BP 97/66 | HR 62 | Temp 97.8°F | Resp 18 | Ht 67.5 in | Wt 191.0 lb

## 2016-10-17 DIAGNOSIS — Z1211 Encounter for screening for malignant neoplasm of colon: Secondary | ICD-10-CM

## 2016-10-17 DIAGNOSIS — Z1212 Encounter for screening for malignant neoplasm of rectum: Secondary | ICD-10-CM

## 2016-10-17 DIAGNOSIS — D125 Benign neoplasm of sigmoid colon: Secondary | ICD-10-CM | POA: Diagnosis not present

## 2016-10-17 DIAGNOSIS — D129 Benign neoplasm of anus and anal canal: Secondary | ICD-10-CM

## 2016-10-17 DIAGNOSIS — D128 Benign neoplasm of rectum: Secondary | ICD-10-CM | POA: Diagnosis not present

## 2016-10-17 DIAGNOSIS — D122 Benign neoplasm of ascending colon: Secondary | ICD-10-CM | POA: Diagnosis not present

## 2016-10-17 DIAGNOSIS — D126 Benign neoplasm of colon, unspecified: Secondary | ICD-10-CM

## 2016-10-17 DIAGNOSIS — K635 Polyp of colon: Secondary | ICD-10-CM | POA: Diagnosis not present

## 2016-10-17 DIAGNOSIS — D123 Benign neoplasm of transverse colon: Secondary | ICD-10-CM

## 2016-10-17 MED ORDER — SODIUM CHLORIDE 0.9 % IV SOLN: 500.0000 mL | INTRAVENOUS | Status: DC

## 2016-10-17 NOTE — Progress Notes (Signed)
Called to room for pathology. 

## 2016-10-17 NOTE — Progress Notes (Signed)
Report to PACU, RN, vss, BBS= Clear.  

## 2016-10-17 NOTE — Op Note (Signed)
[place] Patient Name: Dennis Richard Procedure Date: 10/17/2016 9:18 AM MRN: [id number] Endoscopist: Remo Lipps P. Acire Tang MD, MD Age: 50 Referring MD:  Date of Birth: [birth year] Gender: Male Account #: [id number] Procedure:                Colonoscopy Indications:              Screening for malignant neoplasm in the colon, This                            is the patient's first colonoscopy Medicines:                Monitored Anesthesia Care Procedure:                Pre-Anesthesia Assessment:                           - Prior to the procedure, a History and Physical                            was performed, and patient medications and                            allergies were reviewed. The patient's tolerance of                            previous anesthesia was also reviewed. The risks                            and benefits of the procedure and the sedation                            options and risks were discussed with the patient.                            All questions were answered, and informed consent                            was obtained. Prior Anticoagulants: The patient has                            taken no previous anticoagulant or antiplatelet                            agents. ASA Grade Assessment: II - A patient with                            mild systemic disease. After reviewing the risks                            and benefits, the patient was deemed in                            satisfactory condition to undergo the procedure.  After obtaining informed consent, the colonoscope                            was passed under direct vision. Throughout the                            procedure, the patient's blood pressure, pulse, and                            oxygen saturations were monitored continuously. The                            Model CF-HQ190L [id number]) scope was introduced                            through the anus  and advanced to the the cecum,                            identified by appendiceal orifice and ileocecal                            valve. The colonoscopy was performed without                            difficulty. The patient tolerated the procedure                            well. The quality of the bowel preparation was                            good. The ileocecal valve, appendiceal orifice, and                            rectum were photographed. Scope In: 9:24:32 AM Scope Out: 9:48:58 AM Scope Withdrawal Time: 0 hours 18 minutes 21 seconds  Total Procedure Duration: 0 hours 24 minutes 26 seconds  Findings:                 The perianal and digital rectal examinations were                            normal.                           A 4 mm polyp was found in the ascending colon. The                            polyp was sessile. The polyp was removed with a                            cold snare. Resection and retrieval were complete.                           A 5 mm polyp was found in the splenic flexure. The  polyp was sessile. The polyp was removed with a                            cold snare. Resection and retrieval were complete.                           Two sessile polyps were found in the sigmoid colon.                            The polyps were 4 to 5 mm in size. These polyps                            were removed with a cold snare. Resection and                            retrieval were complete.                           A 13 mm polyp was found in the rectum. The polyp                            was pedunculated. The polyp was removed with a hot                            snare. Following resection of the head there was a                            stalk that remained, this was removed with hot                            snare in case there was not a clear margin after                            resection of the polyp head. Resection and                             retrieval were complete.                           A few small-mouthed diverticula were found in the                            sigmoid colon.                           The exam was otherwise without abnormality on                            direct and retroflexion views. Complications:            No immediate complications. Estimated blood loss:                            Minimal.  Estimated Blood Loss:     Estimated blood loss was minimal. Impression:               - One 4 mm polyp in the ascending colon, removed                            with a cold snare. Resected and retrieved.                           - One 5 mm polyp at the splenic flexure, removed                            with a cold snare. Resected and retrieved.                           - Two 4 to 5 mm polyps in the sigmoid colon,                            removed with a cold snare. Resected and retrieved.                           - One 13 mm polyp in the rectum, removed with a hot                            snare. Resected and retrieved.                           - Diverticulosis in the sigmoid colon.                           - The examination was otherwise normal on direct                            and retroflexion views. Recommendation:           - Patient has a contact number available for                            emergencies. The signs and symptoms of potential                            delayed complications were discussed with the                            patient. Return to normal activities tomorrow.                            Written discharge instructions were provided to the                            patient.                           - Resume previous diet.                           -  Continue present medications.                           - No ibuprofen, naproxen, or other non-steroidal                            anti-inflammatory drugs for 2 weeks after polyp                             removal.                           - Await pathology results.                           - Repeat colonoscopy is recommended for                            surveillance. The colonoscopy date will be                            determined after pathology results from today's                            exam become available for review. Remo Lipps P. Harlow Basley MD, MD 10/17/2016 9:54:20 AM This report has been signed electronically.

## 2016-10-17 NOTE — Patient Instructions (Signed)
   NO IBUPROFEN,NAPROXEN,OR OTHER NON STEROIDAL ANTI INFLAMMATORY MEDICATIONS FOR 2 WEEKS AFTER POLYP REMOVAL   AWAIT PATHOLOGY RESULTS  INFORMATION ON POLYPS AND DIVERTICULOSIS GIVEN TO YOU TODAY   YOU HAD AN ENDOSCOPIC PROCEDURE TODAY AT [facility]:   Refer to the procedure report that was given to you for any specific questions about what was found during the examination.  If the procedure report does not answer your questions, please call your gastroenterologist to clarify.  If you requested that your care partner not be given the details of your procedure findings, then the procedure report has been included in a sealed envelope for you to review at your convenience later.  YOU SHOULD EXPECT: Some feelings of bloating in the abdomen. Passage of more gas than usual.  Walking can help get rid of the air that was put into your GI tract during the procedure and reduce the bloating. If you had a lower endoscopy (such as a colonoscopy or flexible sigmoidoscopy) you may notice spotting of blood in your stool or on the toilet paper. If you underwent a bowel prep for your procedure, you may not have a normal bowel movement for a few days.  Please Note:  You might notice some irritation and congestion in your nose or some drainage.  This is from the oxygen used during your procedure.  There is no need for concern and it should clear up in a day or so.  SYMPTOMS TO REPORT IMMEDIATELY:   Following lower endoscopy (colonoscopy or flexible sigmoidoscopy):  Excessive amounts of blood in the stool  Significant tenderness or worsening of abdominal pains  Swelling of the abdomen that is new, acute  Fever of 100F or higher    For urgent or emergent issues, a gastroenterologist can be reached at any hour by calling [phone].   DIET:  We do recommend a small meal at first, but then you may proceed to your regular diet.  Drink plenty of fluids but you should avoid alcoholic  beverages for 24 hours.  ACTIVITY:  You should plan to take it easy for the rest of today and you should NOT DRIVE or use heavy machinery until tomorrow (because of the sedation medicines used during the test).    FOLLOW UP: Our staff will call the number listed on your records the next business day following your procedure to check on you and address any questions or concerns that you may have regarding the information given to you following your procedure. If we do not reach you, we will leave a message.  However, if you are feeling well and you are not experiencing any problems, there is no need to return our call.  We will assume that you have returned to your regular daily activities without incident.  If any biopsies were taken you will be contacted by phone or by letter within the next 1-3 weeks.  Please call us at [phone] if you have not heard about the biopsies in 3 weeks.    SIGNATURES/CONFIDENTIALITY: You and/or your care partner have signed paperwork which will be entered into your electronic medical record.  These signatures attest to the fact that that the information above on your After Visit Summary has been reviewed and is understood.  Full responsibility of the confidentiality of this discharge information lies with you and/or your care-partner.

## 2016-10-18 ENCOUNTER — Telehealth: Payer: Self-pay | Admitting: *Deleted

## 2016-10-18 NOTE — Telephone Encounter (Signed)
  Follow up Call-  Call back number 10/17/2016  Post procedure Call Back phone  # [phone]  Permission to leave phone message Yes  Some recent data might be hidden     Patient questions:  Do you have a fever, pain , or abdominal swelling? No. Pain Score  0 *  Have you tolerated food without any problems? Yes.    Have you been able to return to your normal activities? Yes.    Do you have any questions about your discharge instructions: Diet   No. Medications  No. Follow up visit  No.  Do you have questions or concerns about your Care? No.  Actions: * If pain score is 4 or above: No action needed, pain <4.

## 2018-01-09 NOTE — Progress Notes (Signed)
Subjective:    Patient ID: Dennis Richard, male    DOB: [birth year], 52 y.o.   MRN: [id number]  HPI: Dennis Richard is here to establish as a new pt.  He is a pleasant 52 year old male. PMH: Chronic back pain- well treated with daily stretching and tobacco use- 4-5 cigarettes/day. He takes various supplements and eats a mostly heart healthy diet. He estimates to drink 20 pz water a day, prefers to hydrate with coke-cola. He has colonoscopy 2018- 3 precancerous polyps removed, recommended to repeat colonoscopy in 3 years-followed by  GI He reports "my wife wants me to have a prostate exam".  He denies urinary sx's or first degree family hx of prostate disease.  We discussed USPSTF recommendation regarding PSA and DRE.  Patient Care Team    Relationship Specialty Notifications Start End  Mina Marble D, NP PCP - General Family Medicine  01/10/18   Armbruster, Carlota Raspberry, MD Consulting Physician Gastroenterology  01/10/18     Patient Active Problem List   Diagnosis Date Noted  . Healthcare maintenance 03/22/2016  . Acute appendicitis 11/17/2015     Past Medical History:  Diagnosis Date  . Anemia    childhood  . Chicken pox   . GERD (gastroesophageal reflux disease)   . Umbilical hernia      Past Surgical History:  Procedure Laterality Date  . ACHILLES TENDON REPAIR Right 2009   achilles rupture and repair  . APPENDECTOMY  11/17/2015  . INGUINAL HERNIA REPAIR Bilateral [place] x3  . LAPAROSCOPIC APPENDECTOMY N/A 11/17/2015   Procedure: APPENDECTOMY LAPAROSCOPIC;  Surgeon: Ralene Ok, MD;  Location: [facility];  Service: General;  Laterality: N/A;  . TONSILLECTOMY    . WISDOM TOOTH EXTRACTION       Family History  Problem Relation Age of Onset  . Hyperlipidemia Mother   . Hypertension Mother   . Hyperlipidemia Father   . Diabetes Father   . Hypertension Father   . Heart disease Maternal Grandmother   . Heart disease Paternal Grandmother   . Diabetes  Paternal Grandmother   . Hyperlipidemia Paternal Grandfather   . Hypertension Paternal Grandfather   . Colon cancer Neg Hx      Social History   Substance and Sexual Activity  Drug Use No     Social History   Substance and Sexual Activity  Alcohol Use Yes  . Alcohol/week: 7.8 oz  . Types: 7 Glasses of wine, 6 Cans of beer per week     Social History   Tobacco Use  Smoking Status Current Every Day Smoker  . Packs/day: 0.50  . Years: 33.00  . Pack years: 16.50  . Types: Cigarettes  Smokeless Tobacco Former Systems developer  . Types: Snuff, Chew     Outpatient Encounter Medications as of 01/10/2018  Medication Sig  . CINNAMON PO Take by mouth.  . Coenzyme Q10 (COQ10 PO) Take by mouth.  . ELDERBERRY PO Take by mouth.  . Multiple Vitamins-Minerals (MULTIVITAMIN ADULTS PO) Take by mouth.  Marland Kitchen OVER THE COUNTER MEDICATION Take by mouth daily. Burdock root  . OVER THE COUNTER MEDICATION Take 1 tablet by mouth daily.  . Probiotic Product (PROBIOTIC DAILY PO) Take by mouth.  . RED CLOVER LEAF EXTRACT PO Take by mouth.  Marland Kitchen Specialty Vitamins Products (BILBERRY PLUS PO) Take by mouth.  . TURMERIC PO Take by mouth.  Marland Kitchen Zn-Pyg Afri-Nettle-Saw Palmet (SAW PALMETTO COMPLEX PO) Take by mouth. Pomegranate, grape seed oil  Facility-Administered Encounter Medications as of 01/10/2018  Medication  . 0.9 %  sodium chloride infusion    Allergies: Patient has no known allergies.  Body mass index is 29.91 kg/m.  Blood pressure 107/64, pulse 78, height 5' 7.75" (1.721 m), weight 195 lb 4.8 oz (88.6 kg), SpO2 96 %.     Review of Systems  Constitutional: Positive for fatigue. Negative for activity change, appetite change, chills, diaphoresis, fever and unexpected weight change.  HENT: Negative for congestion.   Eyes: Negative for visual disturbance.  Respiratory: Negative for cough, chest tightness, shortness of breath, wheezing and stridor.   Cardiovascular: Negative for chest pain,  palpitations and leg swelling.  Gastrointestinal: Negative for abdominal distention, abdominal pain, blood in stool, constipation, diarrhea, nausea and vomiting.  Endocrine: Negative for cold intolerance, heat intolerance, polydipsia, polyphagia and polyuria.  Genitourinary: Negative for difficulty urinating and flank pain.  Musculoskeletal: Positive for arthralgias, back pain and myalgias. Negative for gait problem and joint swelling.  Neurological: Negative for dizziness and headaches.  Hematological: Does not bruise/bleed easily.  Psychiatric/Behavioral: Negative for self-injury, sleep disturbance and suicidal ideas. The patient is not nervous/anxious and is not hyperactive.        Objective:   Physical Exam  Constitutional: He is oriented to person, place, and time. He appears well-developed and well-nourished. No distress.  HENT:  Head: Normocephalic and atraumatic.  Right Ear: External ear normal.  Left Ear: External ear normal.  Cardiovascular: Normal rate, regular rhythm, normal heart sounds and intact distal pulses.  No murmur heard. Pulmonary/Chest: Effort normal and breath sounds normal. No respiratory distress. He has no wheezes. He has no rales. He exhibits no tenderness.  Neurological: He is alert and oriented to person, place, and time.  Skin: Skin is warm and dry. No rash noted. He is not diaphoretic. No erythema. No pallor.  Psychiatric: He has a normal mood and affect. His behavior is normal. Judgment and thought content normal.  Nursing note and vitals reviewed.         Assessment & Plan:   1. Need for Tdap vaccination   2. Healthcare maintenance     Healthcare maintenance Increase water intake, strive for at least 90 ounces/day.   Follow Heart Healthy diet Increase regular exercise.  Recommend at least 30 minutes daily, 5 days per week of walking, jogging, biking, swimming, YouTube/Pinterest workout videos. Please schedule complete physical with fasting  labs at your convenience. Reduce soda intake and avoid tobacco completely. Discussed USPSTF guidelines regarding prostate ca screening- information provided.    FOLLOW-UP:  Return in about 1 year (around 01/10/2019) for CPE, Fasting Labs.

## 2018-01-10 ENCOUNTER — Encounter: Payer: Self-pay | Admitting: Adult Health

## 2018-01-10 ENCOUNTER — Ambulatory Visit: Payer: PRIVATE HEALTH INSURANCE | Admitting: Adult Health

## 2018-01-10 VITALS — BP 107/64 | HR 78 | Ht 67.75 in | Wt 195.3 lb

## 2018-01-10 DIAGNOSIS — Z0000 Encounter for general adult medical examination without abnormal findings: Secondary | ICD-10-CM | POA: Diagnosis not present

## 2018-01-10 DIAGNOSIS — Z23 Encounter for immunization: Secondary | ICD-10-CM | POA: Diagnosis not present

## 2018-01-10 NOTE — Patient Instructions (Signed)
Heart-Healthy Eating Plan Many factors influence your heart health, including eating and exercise habits. Heart (coronary) risk increases with abnormal blood fat (lipid) levels. Heart-healthy meal planning includes limiting unhealthy fats, increasing healthy fats, and making other small dietary changes. This includes maintaining a healthy body weight to help keep lipid levels within a normal range. What is my plan? Your health care provider recommends that you:  Get no more than __25__% of the total calories in your daily diet from fat.  Limit your intake of saturated fat to less than ___5___% of your total calories each day.  Limit the amount of cholesterol in your diet to less than _300__ mg per day.  What types of fat should I choose?  Choose healthy fats more often. Choose monounsaturated and polyunsaturated fats, such as olive oil and canola oil, flaxseeds, walnuts, almonds, and seeds.  Eat more omega-3 fats. Good choices include salmon, mackerel, sardines, tuna, flaxseed oil, and ground flaxseeds. Aim to eat fish at least two times each week.  Limit saturated fats. Saturated fats are primarily found in animal products, such as meats, butter, and cream. Plant sources of saturated fats include palm oil, palm kernel oil, and coconut oil.  Avoid foods with partially hydrogenated oils in them. These contain trans fats. Examples of foods that contain trans fats are stick margarine, some tub margarines, cookies, crackers, and other baked goods. What general guidelines do I need to follow?  Check food labels carefully to identify foods with trans fats or high amounts of saturated fat.  Fill one half of your plate with vegetables and green salads. Eat 4-5 servings of vegetables per day. A serving of vegetables equals 1 cup of raw leafy vegetables,  cup of raw or cooked cut-up vegetables, or  cup of vegetable juice.  Fill one fourth of your plate with whole grains. Look for the word "whole"  as the first word in the ingredient list.  Fill one fourth of your plate with lean protein foods.  Eat 4-5 servings of fruit per day. A serving of fruit equals one medium whole fruit,  cup of dried fruit,  cup of fresh, frozen, or canned fruit, or  cup of 100% fruit juice.  Eat more foods that contain soluble fiber. Examples of foods that contain this type of fiber are apples, broccoli, carrots, beans, peas, and barley. Aim to get 20-30 g of fiber per day.  Eat more home-cooked food and less restaurant, buffet, and fast food.  Limit or avoid alcohol.  Limit foods that are high in starch and sugar.  Avoid fried foods.  Cook foods by using methods other than frying. Baking, boiling, grilling, and broiling are all great options. Other fat-reducing suggestions include: ? Removing the skin from poultry. ? Removing all visible fats from meats. ? Skimming the fat off of stews, soups, and gravies before serving them. ? Steaming vegetables in water or broth.  Lose weight if you are overweight. Losing just 5-10% of your initial body weight can help your overall health and prevent diseases such as diabetes and heart disease.  Increase your consumption of nuts, legumes, and seeds to 4-5 servings per week. One serving of dried beans or legumes equals  cup after being cooked, one serving of nuts equals 1 ounces, and one serving of seeds equals  ounce or 1 tablespoon.  You may need to monitor your salt (sodium) intake, especially if you have high blood pressure. Talk with your health care provider or dietitian to get  more information about reducing sodium. What foods can I eat? Grains  Breads, including [place], white, pita, wheat, raisin, rye, oatmeal, and [place]. Tortillas that are neither fried nor made with lard or trans fat. Low-fat rolls, including hotdog and hamburger buns and English muffins. Biscuits. Muffins. Waffles. Pancakes. Light popcorn. Whole-grain cereals. Flatbread. Melba  toast. Pretzels. Breadsticks. Rusks. Low-fat snacks and crackers, including oyster, saltine, matzo, graham, animal, and rye. Rice and pasta, including brown rice and those that are made with whole wheat. Vegetables All vegetables. Fruits All fruits, but limit coconut. Meats and Other Protein Sources Lean, well-trimmed beef, veal, pork, and lamb. Chicken and [place] without skin. All fish and shellfish. Wild duck, rabbit, pheasant, and venison. Egg whites or low-cholesterol egg substitutes. Dried beans, peas, lentils, and tofu.Seeds and most nuts. Dairy Low-fat or nonfat cheeses, including ricotta, string, and mozzarella. Skim or 1% milk that is liquid, powdered, or evaporated. Buttermilk that is made with low-fat milk. Nonfat or low-fat yogurt. Beverages Mineral water. Diet carbonated beverages. Sweets and Desserts Sherbets and fruit ices. Honey, jam, marmalade, jelly, and syrups. Meringues and gelatins. Pure sugar candy, such as hard candy, jelly beans, gumdrops, mints, marshmallows, and small amounts of dark chocolate. [employer] Eat all sweets and desserts in moderation. Fats and Oils Nonhydrogenated (trans-free) margarines. Vegetable oils, including soybean, sesame, sunflower, olive, peanut, safflower, corn, canola, and cottonseed. Salad dressings or mayonnaise that are made with a vegetable oil. Limit added fats and oils that you use for cooking, baking, salads, and as spreads. Other Cocoa powder. Coffee and tea. All seasonings and condiments. The items listed above may not be a complete list of recommended foods or beverages. Contact your dietitian for more options. What foods are not recommended? Grains Breads that are made with saturated or trans fats, oils, or whole milk. Croissants. Butter rolls. Cheese breads. Sweet rolls. Donuts. Buttered popcorn. Chow mein noodles. High-fat crackers, such as cheese or butter crackers. Meats and Other Protein Sources Fatty meats, such as  hotdogs, short ribs, sausage, spareribs, bacon, ribeye roast or steak, and mutton. High-fat deli meats, such as salami and bologna. Caviar. Domestic duck and goose. Organ meats, such as kidney, liver, sweetbreads, brains, gizzard, chitterlings, and heart. Dairy Cream, sour cream, cream cheese, and creamed cottage cheese. Whole milk cheeses, including blue (bleu), Monterey Jack, [place], [place], American, [place], Swiss, [place], [place], and [place]. Whole or 2% milk that is liquid, evaporated, or condensed. Whole buttermilk. Cream sauce or high-fat cheese sauce. Yogurt that is made from whole milk. Beverages Regular sodas and drinks with added sugar. Sweets and Desserts Frosting. Pudding. Cookies. Cakes other than angel food cake. Candy that has milk chocolate or white chocolate, hydrogenated fat, butter, coconut, or unknown ingredients. Buttered syrups. Full-fat ice cream or ice cream drinks. Fats and Oils Gravy that has suet, meat fat, or shortening. Cocoa butter, hydrogenated oils, palm oil, coconut oil, palm kernel oil. These can often be found in baked products, candy, fried foods, nondairy creamers, and whipped toppings. Solid fats and shortenings, including bacon fat, salt pork, lard, and butter. Nondairy cream substitutes, such as coffee creamers and sour cream substitutes. Salad dressings that are made of unknown oils, cheese, or sour cream. The items listed above may not be a complete list of foods and beverages to avoid. Contact your dietitian for more information. This information is not intended to replace advice given to you by your health care provider. Make sure you discuss any questions you have with your health care  provider. Document Released: 09/13/2008 Document Revised: 06/24/2016 Document Reviewed: 05/29/2014 Elsevier Interactive Patient Education  2018 [place] Antigen Test Why am I having this test? The prostate-specific antigen (PSA) test is performed  to determine how much PSA you have in your blood. PSA is a type of protein that is normally present in the prostate gland. Certain conditions can cause PSA blood levels to increase, such as:  Infection in the prostate (prostatitis).  Enlargement of the prostate (hypertrophy).  Prostate cancer.  Because PSA levels increase greatly from prostate cancer, this test can be used to confirm a diagnosis of prostate cancer. It may also be used to monitor treatment for prostate cancer and to watch for a return of prostate cancer after treatment has finished. This test has a very high false-positive rate. Therefore, routine PSA screening for all men is no longer recommended. A false-positive result is incorrect because it indicates a condition or finding is present when it is not. What kind of sample is taken? A blood sample is required for this test. It is usually collected by inserting a needle into a vein or by sticking a finger with a small needle. How do I prepare for this test? There is no preparation required for this test. However, there are factors that can affect the results of a PSA test. To get the most accurate results:  Avoid having a rectal exam within several hours before having your blood drawn for this test.  Avoid having any procedures performed on the prostate gland within 6 weeks of having this test.  Avoid ejaculating within 24 hours of having this test.  Tell your health care provider if you had a recent urinary tract infection (UTI).  Tell your health care provider if you are taking medicines to assist with hair growth, such as finasteride.  Tell your health care provider if you have been exposed to a medicine called diethylstilbestrol.  Let your health care provider know if any of these factors apply to you. You may be asked to reschedule the test. What are the reference ranges? Reference ranges are established after testing a large group of people. Reference ranges may  vary among different people, labs, and hospitals. It is your responsibility to obtain your test results. Ask the lab or department performing the test when and how you will get your results.  Low: 0-2.5 ng/mL.  Slightly to moderately elevated: 2.6-10.0 ng/mL.  Moderately elevated: 10.0-19.9 ng/mL.  Significantly elevated: 20 ng/mL or greater.  What do the results mean? PSA test results greater than 4 ng/mL are found in the majority of men with prostate cancer. If your test result is above this level, this can indicate an increased risk for prostate cancer. Increased PSA levels can also indicate other health conditions. Talk with your health care provider to discuss your results, treatment options, and if necessary, the need for more tests. Talk with your health care provider if you have any questions about your results. Talk with your health care provider to discuss your results, treatment options, and if necessary, the need for more tests. Talk with your health care provider if you have any questions about your results. This information is not intended to replace advice given to you by your health care provider. Make sure you discuss any questions you have with your health care provider. Document Released: 01/07/2005 Document Revised: 08/10/2016 Document Reviewed: 04/30/2014 Elsevier Interactive Patient Education  2018 [employer].  Increase water intake, strive for at least 90  ounces/day.   Follow Heart Healthy diet Increase regular exercise.  Recommend at least 30 minutes daily, 5 days per week of walking, jogging, biking, swimming, YouTube/Pinterest workout videos. Please schedule complete physical with fasting labs at your convenience. Reduce soda intake and avoid tobacco completely. WELCOME TO THE PRACTICE!

## 2018-01-10 NOTE — Assessment & Plan Note (Addendum)
Increase water intake, strive for at least 90 ounces/day.   Follow Heart Healthy diet Increase regular exercise.  Recommend at least 30 minutes daily, 5 days per week of walking, jogging, biking, swimming, YouTube/Pinterest workout videos. Please schedule complete physical with fasting labs at your convenience. Reduce soda intake and avoid tobacco completely. Discussed USPSTF guidelines regarding prostate ca screening- information provided.

## 2018-01-30 ENCOUNTER — Other Ambulatory Visit: Payer: PRIVATE HEALTH INSURANCE

## 2018-01-30 DIAGNOSIS — Z0000 Encounter for general adult medical examination without abnormal findings: Secondary | ICD-10-CM

## 2018-01-31 ENCOUNTER — Other Ambulatory Visit: Payer: Self-pay | Admitting: Adult Health

## 2018-01-31 DIAGNOSIS — E559 Vitamin D deficiency, unspecified: Secondary | ICD-10-CM

## 2018-01-31 LAB — COMPREHENSIVE METABOLIC PANEL
ALBUMIN: 4.3 g/dL (ref 3.5–5.5)
ALT: 17 IU/L (ref 0–44)
AST: 22 IU/L (ref 0–40)
Albumin/Globulin Ratio: 2.2 (ref 1.2–2.2)
Alkaline Phosphatase: 59 IU/L (ref 39–117)
BUN / CREAT RATIO: 11 (ref 9–20)
BUN: 10 mg/dL (ref 6–24)
Bilirubin Total: <0.2 mg/dL (ref 0.0–1.2)
CALCIUM: 9.2 mg/dL (ref 8.7–10.2)
CO2: 21 mmol/L (ref 20–29)
CREATININE: 0.91 mg/dL (ref 0.76–1.27)
Chloride: 103 mmol/L (ref 96–106)
GFR calc Af Amer: 112 mL/min/{1.73_m2} (ref >59)
GFR, EST NON AFRICAN AMERICAN: 97 mL/min/{1.73_m2} (ref >59)
GLOBULIN, TOTAL: 2 g/dL (ref 1.5–4.5)
GLUCOSE: 97 mg/dL (ref 65–99)
Potassium: 4.1 mmol/L (ref 3.5–5.2)
Sodium: 138 mmol/L (ref 134–144)
Total Protein: 6.3 g/dL (ref 6.0–8.5)

## 2018-01-31 LAB — HEMOGLOBIN A1C
ESTIMATED AVERAGE GLUCOSE: 114 mg/dL
Hgb A1c MFr Bld: 5.6 % (ref 4.8–5.6)

## 2018-01-31 LAB — CBC WITH DIFFERENTIAL/PLATELET
BASOS: 1 %
Basophils Absolute: 0 10*3/uL (ref 0.0–0.2)
EOS (ABSOLUTE): 0.2 10*3/uL (ref 0.0–0.4)
EOS: 3 %
HEMATOCRIT: 45 % (ref 37.5–51.0)
HEMOGLOBIN: 15.6 g/dL (ref 13.0–17.7)
Immature Grans (Abs): 0 10*3/uL (ref 0.0–0.1)
Immature Granulocytes: 0 %
Lymphocytes Absolute: 2.1 10*3/uL (ref 0.7–3.1)
Lymphs: 37 %
MCH: 33.5 pg — ABNORMAL HIGH (ref 26.6–33.0)
MCHC: 34.7 g/dL (ref 31.5–35.7)
MCV: 97 fL (ref 79–97)
MONOCYTES: 9 %
MONOS ABS: 0.5 10*3/uL (ref 0.1–0.9)
NEUTROS PCT: 50 %
Neutrophils Absolute: 3 10*3/uL (ref 1.4–7.0)
Platelets: 233 10*3/uL (ref 150–379)
RBC: 4.66 x10E6/uL (ref 4.14–5.80)
RDW: 12.9 % (ref 12.3–15.4)
WBC: 5.8 10*3/uL (ref 3.4–10.8)

## 2018-01-31 LAB — LIPID PANEL
CHOLESTEROL TOTAL: 226 mg/dL — AB (ref 100–199)
Chol/HDL Ratio: 5 ratio (ref 0.0–5.0)
HDL: 45 mg/dL (ref >39)
LDL Calculated: 121 mg/dL — ABNORMAL HIGH (ref 0–99)
Triglycerides: 302 mg/dL — ABNORMAL HIGH (ref 0–149)
VLDL Cholesterol Cal: 60 mg/dL — ABNORMAL HIGH (ref 5–40)

## 2018-01-31 LAB — TSH: TSH: 1.25 u[IU]/mL (ref 0.450–4.500)

## 2018-01-31 LAB — VITAMIN D 25 HYDROXY (VIT D DEFICIENCY, FRACTURES): Vit D, 25-Hydroxy: 27.4 ng/mL — ABNORMAL LOW (ref 30.0–100.0)

## 2018-01-31 MED ORDER — VITAMIN D (ERGOCALCIFEROL) 1.25 MG (50000 UNIT) PO CAPS: 50000.0000 [IU] | ORAL_CAPSULE | ORAL | 0 refills | Status: DC

## 2018-02-01 NOTE — Progress Notes (Signed)
Subjective:    Patient ID: Dennis Richard, male    DOB: [birth year], 52 y.o.   MRN: [id number]  HPI:01/10/18 OV:  Dennis Richard is here to establish as a new pt.  He is a pleasant 52 year old male. PMH: Chronic back pain- well treated with daily stretching and tobacco use- 4-5 cigarettes/day. He takes various supplements and eats a mostly heart healthy diet. He estimates to drink 20 pz water a day, prefers to hydrate with coke-cola. He had colonoscopy 2018- 3 precancerous polyps removed, recommended to repeat colonoscopy in 3 years-followed by [facility] He reports "my wife wants me to have a prostate exam".  He denies urinary sx's or first degree family hx of prostate disease.  We discussed USPSTF recommendation regarding PSA and DRE.  02/06/18 OV: Dennis Richard presents for CPE. He denies acute complaints. We reviewed recent labs at length and he declines statin therapy The 10-year ASCVD risk score Dennis Bussing DC Brooke Bonito., Dennis al., Dennis Richard) is: 9.7%   Values used to calculate the score:     Age: 54 years     Sex: Male     Is Non-Hispanic African American: No     Diabetic: No     Tobacco smoker: Yes     Systolic Blood Pressure: 630 mmHg     Is BP treated: No     HDL Cholesterol: 45 mg/dL     Total Cholesterol: 226 mg/dL   We again discussed USPSTF guidelines in regards to Prostate screening. He does not have sx's or family hx but vehemently requests PSA level checked and DRE. He continues to smoke 3/4 to a pack/day for > 30 years We discussed several options for smoking cessation today. He has failed Nicoderm patch in the past. He denies hx or acute depression.  He denies thoughts of harming himself/others.  Healthcare Maintenance: Colonoscopy- due 2021, followed by [facility]   Patient Care Team    Relationship Specialty Notifications Start End  Esaw Grandchild, NP PCP - General Family Medicine  01/10/18   Armbruster, Carlota Raspberry, MD Consulting Physician Gastroenterology  01/10/18     Patient Active  Problem List   Diagnosis Date Noted  . Hyperlipidemia 02/06/2018  . Screening for colon cancer 02/06/2018  . Screening for prostate cancer 02/06/2018  . Tobacco use disorder 02/06/2018  . Healthcare maintenance 03/22/2016  . Acute appendicitis 11/17/2015     Past Medical History:  Diagnosis Date  . Anemia    childhood  . Chicken pox   . GERD (gastroesophageal reflux disease)   . Umbilical hernia      Past Surgical History:  Procedure Laterality Date  . ACHILLES TENDON REPAIR Right 2009   achilles rupture and repair  . APPENDECTOMY  11/17/2015  . INGUINAL HERNIA REPAIR Bilateral [place] x3  . LAPAROSCOPIC APPENDECTOMY N/A 11/17/2015   Procedure: APPENDECTOMY LAPAROSCOPIC;  Surgeon: Ralene Ok, MD;  Location: [place];  Service: General;  Laterality: N/A;  . TONSILLECTOMY    . WISDOM TOOTH EXTRACTION       Family History  Problem Relation Age of Onset  . Hyperlipidemia Mother   . Hypertension Mother   . Hyperlipidemia Father   . Diabetes Father   . Hypertension Father   . Heart disease Maternal Grandmother   . Heart disease Paternal Grandmother   . Diabetes Paternal Grandmother   . Hyperlipidemia Paternal Grandfather   . Hypertension Paternal Grandfather   . Colon cancer Neg Hx  Social History   Substance and Sexual Activity  Drug Use No     Social History   Substance and Sexual Activity  Alcohol Use Yes  . Alcohol/week: 7.8 oz  . Types: 7 Glasses of wine, 6 Cans of beer per week     Social History   Tobacco Use  Smoking Status Current Every Day Smoker  . Packs/day: 0.50  . Years: 33.00  . Pack years: 16.50  . Types: Cigarettes  Smokeless Tobacco Former Systems developer  . Types: Snuff, Chew     Outpatient Encounter Medications as of 02/06/2018  Medication Sig  . CINNAMON PO Take by mouth.  . Coenzyme Q10 (COQ10 PO) Take by mouth.  . ELDERBERRY PO Take by mouth.  . Multiple Vitamins-Minerals (MULTIVITAMIN ADULTS PO) Take by  mouth.  Marland Kitchen OVER THE COUNTER MEDICATION Take by mouth daily. Burdock root  . OVER THE COUNTER MEDICATION Take 1 tablet by mouth daily.  . Probiotic Product (PROBIOTIC DAILY PO) Take by mouth.  . RED CLOVER LEAF EXTRACT PO Take by mouth.  Marland Kitchen Specialty Vitamins Products (BILBERRY PLUS PO) Take by mouth.  . TURMERIC PO Take by mouth.  Marland Kitchen Zn-Pyg Afri-Nettle-Saw Palmet (SAW PALMETTO COMPLEX PO) Take by mouth. Pomegranate, grape seed oil  . [DISCONTINUED] Vitamin D, Ergocalciferol, (DRISDOL) 50000 units CAPS capsule Take 1 capsule (50,000 Units total) by mouth every 7 (seven) days.   Facility-Administered Encounter Medications as of 02/06/2018  Medication  . 0.9 %  sodium chloride infusion    Allergies: Patient has no known allergies.  Body mass index is 29.53 kg/m.  Blood pressure 121/72, pulse 88, height 5' 7.75" (1.721 m), weight 192 lb 12.8 oz (87.5 kg).     Review of Systems  Constitutional: Positive for fatigue. Negative for activity change, appetite change, chills, diaphoresis, fever and unexpected weight change.  HENT: Negative for congestion.   Eyes: Negative for visual disturbance.  Respiratory: Negative for cough, chest tightness, shortness of breath, wheezing and stridor.   Cardiovascular: Negative for chest pain, palpitations and leg swelling.  Gastrointestinal: Negative for abdominal distention, abdominal pain, blood in stool, constipation, diarrhea, nausea and vomiting.  Endocrine: Negative for cold intolerance, heat intolerance, polydipsia, polyphagia and polyuria.  Genitourinary: Negative for difficulty urinating and flank pain.  Musculoskeletal: Positive for arthralgias, back pain and myalgias. Negative for gait problem and joint swelling.  Neurological: Negative for dizziness and headaches.  Hematological: Does not bruise/bleed easily.  Psychiatric/Behavioral: Negative for self-injury, sleep disturbance and suicidal ideas. The patient is not nervous/anxious and is not  hyperactive.        Objective:   Physical Exam  Constitutional: He is oriented to person, place, and time. He appears well-developed and well-nourished. No distress.  HENT:  Head: Normocephalic and atraumatic.  Right Ear: Hearing, tympanic membrane, external ear and ear canal normal. Tympanic membrane is not erythematous and not bulging. No decreased hearing is noted.  Left Ear: Hearing, tympanic membrane, external ear and ear canal normal. Tympanic membrane is not erythematous and not bulging. No decreased hearing is noted.  Nose: Nose normal. Right sinus exhibits no maxillary sinus tenderness and no frontal sinus tenderness. Left sinus exhibits no maxillary sinus tenderness and no frontal sinus tenderness.  Mouth/Throat: Uvula is midline, oropharynx is clear and moist and mucous membranes are normal.  Eyes: Conjunctivae are normal. Pupils are equal, round, and reactive to light.  Neck: Normal range of motion. Neck supple.  Cardiovascular: Normal rate, regular rhythm, normal heart sounds and intact distal pulses.  No murmur heard. Pulmonary/Chest: Effort normal and breath sounds normal. No respiratory distress. He has no wheezes. He has no rales. He exhibits no tenderness.  Abdominal: Soft. Bowel sounds are normal. He exhibits no distension and no mass. There is no tenderness. There is no rebound and no guarding.  Genitourinary: Prostate normal. Rectal exam shows guaiac negative stool.  Genitourinary Comments: Chaperone present during examination.  Musculoskeletal: He exhibits tenderness.       Lumbar back: He exhibits tenderness.  Lymphadenopathy:    He has no cervical adenopathy.  Neurological: He is alert and oriented to person, place, and time. Coordination normal.  Skin: Skin is warm and dry. No rash noted. He is not diaphoretic. No erythema. No pallor.  Psychiatric: He has a normal mood and affect. His behavior is normal. Judgment and thought content normal.  Nursing note and  vitals reviewed.         Assessment & Plan:   1. Screening for prostate cancer   2. Screening for colon cancer   3. Hyperlipidemia, unspecified hyperlipidemia type   4. Healthcare maintenance   5. Tobacco use disorder     Healthcare maintenance Increase water intake, strive for at least 85 ounces/day.   Follow Heart Healthy diet, will re-check lipids in 8 months. Increase regular exercise.  Recommend at least 30 minutes daily, 5 days per week of walking, jogging, biking, swimming, YouTube/Pinterest workout videos. Please consider smoking cessation- either Chantix or Wellbutrin.  If you decide to start one of the rx's please call clinic and we will send in order. We will call you when PSA lab results are available. Return in 8 months for lipid check and 12 months for office visit.  Screening for prostate cancer Prostate exam normal PSA drawn prior to exam, will call when results are available.  Tobacco use disorder We discussed several options for smoking cessation today. He has failed Nicoderm patch in the past. He denies hx or acute depression.  He denies thoughts of harming himself/others. If he would like to start either Chantix or Wellbutrin, please call clinic for [place] to send in rx Will need f/u in 4 weeks if therapy started  Hyperlipidemia The 10-year ASCVD risk score Dennis Bussing DC Brooke Bonito., Dennis al., Dennis Richard) is: 9.7%   Values used to calculate the score:     Age: 64 years     Sex: Male     Is Non-Hispanic African American: No     Diabetic: No     Tobacco smoker: Yes     Systolic Blood Pressure: 482 mmHg     Is BP treated: No     HDL Cholesterol: 45 mg/dL     Total Cholesterol: 226 mg/dL   He declines statin therapy Prefers to try TLC, will re-check lipids in 8 months    FOLLOW-UP:  Return in about 1 year (around 02/06/2019) for CPE.

## 2018-02-06 ENCOUNTER — Encounter: Payer: Self-pay | Admitting: Adult Health

## 2018-02-06 ENCOUNTER — Ambulatory Visit (INDEPENDENT_AMBULATORY_CARE_PROVIDER_SITE_OTHER): Payer: PRIVATE HEALTH INSURANCE | Admitting: Adult Health

## 2018-02-06 ENCOUNTER — Other Ambulatory Visit: Payer: Self-pay | Admitting: Adult Health

## 2018-02-06 VITALS — BP 121/72 | HR 88 | Ht 67.75 in | Wt 192.8 lb

## 2018-02-06 DIAGNOSIS — E785 Hyperlipidemia, unspecified: Secondary | ICD-10-CM

## 2018-02-06 DIAGNOSIS — Z0000 Encounter for general adult medical examination without abnormal findings: Secondary | ICD-10-CM

## 2018-02-06 DIAGNOSIS — Z125 Encounter for screening for malignant neoplasm of prostate: Secondary | ICD-10-CM | POA: Diagnosis not present

## 2018-02-06 DIAGNOSIS — Z1211 Encounter for screening for malignant neoplasm of colon: Secondary | ICD-10-CM

## 2018-02-06 DIAGNOSIS — E559 Vitamin D deficiency, unspecified: Secondary | ICD-10-CM

## 2018-02-06 DIAGNOSIS — F17200 Nicotine dependence, unspecified, uncomplicated: Secondary | ICD-10-CM | POA: Diagnosis not present

## 2018-02-06 LAB — POC HEMOCCULT BLD/STL (OFFICE/1-CARD/DIAGNOSTIC): Fecal Occult Blood, POC: NEGATIVE

## 2018-02-06 NOTE — Patient Instructions (Signed)
Heart-Healthy Eating Plan Many factors influence your heart health, including eating and exercise habits. Heart (coronary) risk increases with abnormal blood fat (lipid) levels. Heart-healthy meal planning includes limiting unhealthy fats, increasing healthy fats, and making other small dietary changes. This includes maintaining a healthy body weight to help keep lipid levels within a normal range. What is my plan? Your health care provider recommends that you:  Get no more than __25___% of the total calories in your daily diet from fat.  Limit your intake of saturated fat to less than __5___% of your total calories each day.  Limit the amount of cholesterol in your diet to less than _300__ mg per day.  What types of fat should I choose?  Choose healthy fats more often. Choose monounsaturated and polyunsaturated fats, such as olive oil and canola oil, flaxseeds, walnuts, almonds, and seeds.  Eat more omega-3 fats. Good choices include salmon, mackerel, sardines, tuna, flaxseed oil, and ground flaxseeds. Aim to eat fish at least two times each week.  Limit saturated fats. Saturated fats are primarily found in animal products, such as meats, butter, and cream. Plant sources of saturated fats include palm oil, palm kernel oil, and coconut oil.  Avoid foods with partially hydrogenated oils in them. These contain trans fats. Examples of foods that contain trans fats are stick margarine, some tub margarines, cookies, crackers, and other baked goods. What general guidelines do I need to follow?  Check food labels carefully to identify foods with trans fats or high amounts of saturated fat.  Fill one half of your plate with vegetables and green salads. Eat 4-5 servings of vegetables per day. A serving of vegetables equals 1 cup of raw leafy vegetables,  cup of raw or cooked cut-up vegetables, or  cup of vegetable juice.  Fill one fourth of your plate with whole grains. Look for the word "whole"  as the first word in the ingredient list.  Fill one fourth of your plate with lean protein foods.  Eat 4-5 servings of fruit per day. A serving of fruit equals one medium whole fruit,  cup of dried fruit,  cup of fresh, frozen, or canned fruit, or  cup of 100% fruit juice.  Eat more foods that contain soluble fiber. Examples of foods that contain this type of fiber are apples, broccoli, carrots, beans, peas, and barley. Aim to get 20-30 g of fiber per day.  Eat more home-cooked food and less restaurant, buffet, and fast food.  Limit or avoid alcohol.  Limit foods that are high in starch and sugar.  Avoid fried foods.  Cook foods by using methods other than frying. Baking, boiling, grilling, and broiling are all great options. Other fat-reducing suggestions include: ? Removing the skin from poultry. ? Removing all visible fats from meats. ? Skimming the fat off of stews, soups, and gravies before serving them. ? Steaming vegetables in water or broth.  Lose weight if you are overweight. Losing just 5-10% of your initial body weight can help your overall health and prevent diseases such as diabetes and heart disease.  Increase your consumption of nuts, legumes, and seeds to 4-5 servings per week. One serving of dried beans or legumes equals  cup after being cooked, one serving of nuts equals 1 ounces, and one serving of seeds equals  ounce or 1 tablespoon.  You may need to monitor your salt (sodium) intake, especially if you have high blood pressure. Talk with your health care provider or dietitian to get  more information about reducing sodium. What foods can I eat? Grains  Breads, including [place], white, pita, wheat, raisin, rye, oatmeal, and [place]. Tortillas that are neither fried nor made with lard or trans fat. Low-fat rolls, including hotdog and hamburger buns and English muffins. Biscuits. Muffins. Waffles. Pancakes. Light popcorn. Whole-grain cereals. Flatbread. Melba  toast. Pretzels. Breadsticks. Rusks. Low-fat snacks and crackers, including oyster, saltine, matzo, graham, animal, and rye. Rice and pasta, including brown rice and those that are made with whole wheat. Vegetables All vegetables. Fruits All fruits, but limit coconut. Meats and Other Protein Sources Lean, well-trimmed beef, veal, pork, and lamb. Chicken and [place] without skin. All fish and shellfish. Wild duck, rabbit, pheasant, and venison. Egg whites or low-cholesterol egg substitutes. Dried beans, peas, lentils, and tofu.Seeds and most nuts. Dairy Low-fat or nonfat cheeses, including ricotta, string, and mozzarella. Skim or 1% milk that is liquid, powdered, or evaporated. Buttermilk that is made with low-fat milk. Nonfat or low-fat yogurt. Beverages Mineral water. Diet carbonated beverages. Sweets and Desserts Sherbets and fruit ices. Honey, jam, marmalade, jelly, and syrups. Meringues and gelatins. Pure sugar candy, such as hard candy, jelly beans, gumdrops, mints, marshmallows, and small amounts of dark chocolate. [employer] Eat all sweets and desserts in moderation. Fats and Oils Nonhydrogenated (trans-free) margarines. Vegetable oils, including soybean, sesame, sunflower, olive, peanut, safflower, corn, canola, and cottonseed. Salad dressings or mayonnaise that are made with a vegetable oil. Limit added fats and oils that you use for cooking, baking, salads, and as spreads. Other Cocoa powder. Coffee and tea. All seasonings and condiments. The items listed above may not be a complete list of recommended foods or beverages. Contact your dietitian for more options. What foods are not recommended? Grains Breads that are made with saturated or trans fats, oils, or whole milk. Croissants. Butter rolls. Cheese breads. Sweet rolls. Donuts. Buttered popcorn. Chow mein noodles. High-fat crackers, such as cheese or butter crackers. Meats and Other Protein Sources Fatty meats, such as  hotdogs, short ribs, sausage, spareribs, bacon, ribeye roast or steak, and mutton. High-fat deli meats, such as salami and bologna. Caviar. Domestic duck and goose. Organ meats, such as kidney, liver, sweetbreads, brains, gizzard, chitterlings, and heart. Dairy Cream, sour cream, cream cheese, and creamed cottage cheese. Whole milk cheeses, including blue (bleu), Monterey Jack, [place], [place], [place], [place], Swiss, [place], [place], and [place]. Whole or 2% milk that is liquid, evaporated, or condensed. Whole buttermilk. Cream sauce or high-fat cheese sauce. Yogurt that is made from whole milk. Beverages Regular sodas and drinks with added sugar. Sweets and Desserts Frosting. Pudding. Cookies. Cakes other than angel food cake. Candy that has milk chocolate or white chocolate, hydrogenated fat, butter, coconut, or unknown ingredients. Buttered syrups. Full-fat ice cream or ice cream drinks. Fats and Oils Gravy that has suet, meat fat, or shortening. Cocoa butter, hydrogenated oils, palm oil, coconut oil, palm kernel oil. These can often be found in baked products, candy, fried foods, nondairy creamers, and whipped toppings. Solid fats and shortenings, including bacon fat, salt pork, lard, and butter. Nondairy cream substitutes, such as coffee creamers and sour cream substitutes. Salad dressings that are made of unknown oils, cheese, or sour cream. The items listed above may not be a complete list of foods and beverages to avoid. Contact your dietitian for more information. This information is not intended to replace advice given to you by your health care provider. Make sure you discuss any questions you have with your health care  provider. Document Released: 09/13/2008 Document Revised: 06/24/2016 Document Reviewed: 05/29/2014 Elsevier Interactive Patient Education  2018 [employer].   Increase water intake, strive for at least 85 ounces/day.   Follow Heart Healthy diet, will re-check lipids in  8 months. Increase regular exercise.  Recommend at least 30 minutes daily, 5 days per week of walking, jogging, biking, swimming, YouTube/Pinterest workout videos. Please consider smoking cessation- either Chantix or Wellbutrin.  If you decide to start one of the rx's please call clinic and we will send in order. We will call you when PSA lab results are available. Return in 8 months for lipid check and 12 months for office visit. NICE TO SEE YOU!

## 2018-02-06 NOTE — Assessment & Plan Note (Signed)
We discussed several options for smoking cessation today. He has failed Nicoderm patch in the past. He denies hx or acute depression.  He denies thoughts of harming himself/others. If he would like to start either Chantix or Wellbutrin, please call clinic for [place] to send in rx Will need f/u in 4 weeks if therapy started

## 2018-02-06 NOTE — Assessment & Plan Note (Signed)
Level 27.4 He declined rx strength ergocalciferol  Advised to take once daily OTC vit d2000 IU

## 2018-02-06 NOTE — Assessment & Plan Note (Signed)
Increase water intake, strive for at least 85 ounces/day.   Follow Heart Healthy diet, will re-check lipids in 8 months. Increase regular exercise.  Recommend at least 30 minutes daily, 5 days per week of walking, jogging, biking, swimming, YouTube/Pinterest workout videos. Please consider smoking cessation- either Chantix or Wellbutrin.  If you decide to start one of the rx's please call clinic and we will send in order. We will call you when PSA lab results are available. Return in 8 months for lipid check and 12 months for office visit.

## 2018-02-06 NOTE — Assessment & Plan Note (Signed)
Prostate exam normal PSA drawn prior to exam, will call when results are available.

## 2018-02-06 NOTE — Assessment & Plan Note (Signed)
The 10-year ASCVD risk score Mikey Bussing DC Brooke Bonito., et al., 2013) is: 9.7%   Values used to calculate the score:     Age: 52 years     Sex: Male     Is Non-Hispanic African American: No     Diabetic: No     Tobacco smoker: Yes     Systolic Blood Pressure: 991 mmHg     Is BP treated: No     HDL Cholesterol: 45 mg/dL     Total Cholesterol: 226 mg/dL   He declines statin therapy Prefers to try TLC, will re-check lipids in 8 months

## 2018-02-07 LAB — PSA: PROSTATE SPECIFIC AG, SERUM: 1.3 ng/mL (ref 0.0–4.0)

## 2018-02-14 ENCOUNTER — Other Ambulatory Visit (INDEPENDENT_AMBULATORY_CARE_PROVIDER_SITE_OTHER): Payer: PRIVATE HEALTH INSURANCE

## 2018-02-14 DIAGNOSIS — Z1211 Encounter for screening for malignant neoplasm of colon: Secondary | ICD-10-CM

## 2018-02-14 LAB — IFOBT (OCCULT BLOOD)
IFOBT: NEGATIVE
IFOBT: POSITIVE
IFOBT: POSITIVE

## 2018-02-20 ENCOUNTER — Other Ambulatory Visit: Payer: PRIVATE HEALTH INSURANCE

## 2018-02-26 ENCOUNTER — Telehealth: Payer: Self-pay

## 2018-02-26 ENCOUNTER — Encounter: Payer: Self-pay | Admitting: Gastroenterology

## 2018-02-26 ENCOUNTER — Ambulatory Visit: Payer: PRIVATE HEALTH INSURANCE | Admitting: Gastroenterology

## 2018-02-26 VITALS — BP 110/80 | HR 80 | Ht 67.75 in | Wt 189.6 lb

## 2018-02-26 DIAGNOSIS — R195 Other fecal abnormalities: Secondary | ICD-10-CM | POA: Diagnosis not present

## 2018-02-26 NOTE — Progress Notes (Signed)
02/26/2018 Dennis Richard [id number] [birth year]   HISTORY OF PRESENT ILLNESS:  This is a 52 year old male who is known to Dr. Havery Moros for colonoscopy.  he is here today at the request of his PCP for hemoccult positive stool.  Had stool cards performed at routine visit.  No overt bleeding.  Hgb is normal.  No GI complaints.  Very little/rare NSAID use.  Just had colonoscopy performed about 1.5 years ago.  Did have 5 polyps removed and largest was 1 cm in size, all adenomas.  Repeat was recommended in 3 years.   Past Medical History:  Diagnosis Date  . Anemia    childhood  . Chicken pox   . GERD (gastroesophageal reflux disease)   . Umbilical hernia    Past Surgical History:  Procedure Laterality Date  . ACHILLES TENDON REPAIR Right 2009   achilles rupture and repair  . APPENDECTOMY  11/17/2015  . INGUINAL HERNIA REPAIR Bilateral [place] x3  . LAPAROSCOPIC APPENDECTOMY N/A 11/17/2015   Procedure: APPENDECTOMY LAPAROSCOPIC;  Surgeon: Ralene Ok, MD;  Location: [place];  Service: General;  Laterality: N/A;  . TONSILLECTOMY    . WISDOM TOOTH EXTRACTION      reports that he has been smoking cigarettes.  He has a 16.50 pack-year smoking history. He has quit using smokeless tobacco. His smokeless tobacco use included snuff and chew. He reports that he drinks about 7.8 oz of alcohol per week. He reports that he does not use drugs. family history includes Diabetes in his father and paternal grandmother; Heart disease in his maternal grandmother and paternal grandmother; Hyperlipidemia in his father, mother, and paternal grandfather; Hypertension in his father, mother, and paternal grandfather. No Known Allergies    Outpatient Encounter Medications as of 02/26/2018  Medication Sig  . CINNAMON PO Take by mouth.  . Coenzyme Q10 (COQ10 PO) Take by mouth.  . ELDERBERRY PO Take by mouth.  . Multiple Vitamins-Minerals (MULTIVITAMIN ADULTS PO) Take by mouth.  Marland Kitchen OVER THE  COUNTER MEDICATION Take by mouth daily. Burdock root  . OVER THE COUNTER MEDICATION Take 1 tablet by mouth daily.  . Probiotic Product (PROBIOTIC DAILY PO) Take by mouth.  . RED CLOVER LEAF EXTRACT PO Take by mouth.  Marland Kitchen Specialty Vitamins Products (BILBERRY PLUS PO) Take by mouth.  . TURMERIC PO Take by mouth.  Marland Kitchen Zn-Pyg Afri-Nettle-Saw Palmet (SAW PALMETTO COMPLEX PO) Take by mouth. Pomegranate, grape seed oil   Facility-Administered Encounter Medications as of 02/26/2018  Medication  . 0.9 %  sodium chloride infusion     REVIEW OF SYSTEMS  : All other systems reviewed and negative except where noted in the History of Present Illness.   PHYSICAL EXAM: BP 110/80   Pulse 80   Ht 5' 7.75" (1.721 m)   Wt 189 lb 9.6 oz (86 kg)   BMI 29.04 kg/m  General: Well developed white male in no acute distress Head: Normocephalic and atraumatic Eyes:  Sclerae anicteric, conjunctiva pink. Ears: Normal auditory acuity Lungs: Clear throughout to auscultation; no increased WOB. Heart: Regular rate and rhythm Abdomen: Soft, nontender, non distended. No masses or hepatomegaly noted. Normal bowel sounds Musculoskeletal: Symmetrical with no gross deformities  Skin: No lesions on visible extremities Extremities: No edema  Neurological: Alert oriented x 4, grossly non-focal Psychological:  Alert and cooperative. Normal mood and affect  ASSESSMENT AND PLAN: *Hemoccult positive stool:  No overt bleeding.  Hgb is normal.  No GI complaints.  Just had colonoscopy performed about 1.5 years ago.  Did have 5 polyps removed and largest was 1 cm in size, all adenomas.  Repeat was recommended in 3 years.  Stool hemoccults are not recommended and it is unfortunate that they were performed and presented this situation.  I dicussed with the patient the options of observation for now vs repeating colonoscopy and possible EGD since stool cards to do indicate where the blood is coming from.  It would be very unlikely to  have a polyp develop to the size that it would cause any bleeding issues within the past 1.5 years, although it could occur.  He has elected to observe for now.  He will call back if any other symptoms were to occur or if he were to begin seeing evidence of overt bleeding, etc.   CC:  Danford, Berna Spare, NP

## 2018-02-26 NOTE — Progress Notes (Signed)
Agree with assessment as outlined. I think it unlikely that he has a new or residual large polypoid lesion causing the FOBT. However, he had large rectal polyp removed at the last exam. The stalk margin was positive on initial path, but I had removed the stalk distal to that margin  In a separate pass but unfortunately that was not sent to path or processed. Given this discrepancy, recommend colonoscopy to further evaluate and ensure no residual polypoid lesion. I don't think EGD is needed.   [name] can you help coordinate colonoscopy for this patient. Thanks

## 2018-02-26 NOTE — Patient Instructions (Signed)
Call us back if any other symptoms occur. [phone], choose option 2, ask for Jessica's nurse.    If you are age 52 or younger, your body mass index should be between 19-25. Your Body mass index is 29.04 kg/m. If this is out of the aformentioned range listed, please consider follow up with your Primary Care Provider.

## 2018-02-26 NOTE — Telephone Encounter (Signed)
-----   Message from Loralie Champagne, PA-C sent at 02/26/2018  4:23 PM EDT ----- Dennis Richard.  I saw this patient in clinic this afternoon.  We elected not to schedule colonoscopy but I spoke with Dr. Havery Moros after he left and, due to the size of one of his polyps, Dr. Havery Moros thinks that he should have the colonoscopy repeated.  He agreed that the stool test should not have been performed according to guidelines but unfortunately it was ordered and was positive so he thinks that we should act upon it although he believes chances are low that we will find anything major.  He wants to err on the side of caution and not overlook something since he did have a rather large polyp removed previously.  Please schedule colonoscopy and pre-visit if he is agreeable.  Please apologize to him for me.  Thank you,  [name]

## 2018-02-26 NOTE — Telephone Encounter (Signed)
The pt has been advised and previsit and colon has been scheduled.

## 2018-02-26 NOTE — Telephone Encounter (Signed)
Left message on machine to call back  

## 2018-02-27 ENCOUNTER — Ambulatory Visit: Payer: PRIVATE HEALTH INSURANCE | Admitting: Physician Assistant

## 2018-03-05 ENCOUNTER — Ambulatory Visit: Payer: PRIVATE HEALTH INSURANCE | Admitting: Nurse Practitioner

## 2018-03-20 ENCOUNTER — Other Ambulatory Visit: Payer: Self-pay

## 2018-03-20 ENCOUNTER — Ambulatory Visit (AMBULATORY_SURGERY_CENTER): Payer: Self-pay | Admitting: *Deleted

## 2018-03-20 VITALS — Ht 68.0 in | Wt 193.0 lb

## 2018-03-20 DIAGNOSIS — R195 Other fecal abnormalities: Secondary | ICD-10-CM

## 2018-03-20 MED ORDER — NA SULFATE-K SULFATE-MG SULF 17.5-3.13-1.6 GM/177ML PO SOLN: ORAL | 0 refills | Status: DC

## 2018-03-20 NOTE — Progress Notes (Signed)
Patient denies any allergies to eggs or soy. Patient denies any problems with anesthesia/sedation. Patient denies any oxygen use at home. Patient denies taking any diet/weight loss medications or blood thinners. EMMI education assisgned to patient on colonoscopy, this was explained and instructions given to patient. 

## 2018-03-27 ENCOUNTER — Encounter: Payer: Self-pay | Admitting: Gastroenterology

## 2018-03-27 ENCOUNTER — Ambulatory Visit (AMBULATORY_SURGERY_CENTER): Payer: PRIVATE HEALTH INSURANCE | Admitting: Gastroenterology

## 2018-03-27 ENCOUNTER — Other Ambulatory Visit: Payer: Self-pay

## 2018-03-27 VITALS — BP 98/68 | HR 72 | Temp 99.3°F | Resp 16 | Ht 67.0 in | Wt 189.0 lb

## 2018-03-27 DIAGNOSIS — Z86010 Personal history of colonic polyps: Secondary | ICD-10-CM | POA: Diagnosis not present

## 2018-03-27 DIAGNOSIS — R195 Other fecal abnormalities: Secondary | ICD-10-CM

## 2018-03-27 DIAGNOSIS — K635 Polyp of colon: Secondary | ICD-10-CM | POA: Diagnosis not present

## 2018-03-27 DIAGNOSIS — D125 Benign neoplasm of sigmoid colon: Secondary | ICD-10-CM | POA: Diagnosis not present

## 2018-03-27 MED ORDER — SODIUM CHLORIDE 0.9 % IV SOLN: 500.0000 mL | Freq: Once | INTRAVENOUS | Status: DC

## 2018-03-27 NOTE — Progress Notes (Signed)
I have reviewed the patient's medical history in detail and updated the computerized patient record.

## 2018-03-27 NOTE — Progress Notes (Signed)
Called to room to assist during endoscopic procedure.  Patient ID and intended procedure confirmed with present staff. Received instructions for my participation in the procedure from the performing physician.  

## 2018-03-27 NOTE — Op Note (Signed)
[facility] Patient Name: Dennis Richard Procedure Date: 03/27/2018 2:07 PM MRN: [id number] Endoscopist: Remo Lipps P. Armbruster MD, MD Age: 52 Referring MD:  Date of Birth: 25-Mar-1966 Gender: Male Account #: [id number] Procedure:                Colonoscopy Indications:              Positive fecal immunochemical test, history of                            large adenoma removed in 2017 Medicines:                Monitored Anesthesia Care Procedure:                Pre-Anesthesia Assessment:                           - Prior to the procedure, a History and Physical                            was performed, and patient medications and                            allergies were reviewed. The patient's tolerance of                            previous anesthesia was also reviewed. The risks                            and benefits of the procedure and the sedation                            options and risks were discussed with the patient.                            All questions were answered, and informed consent                            was obtained. Prior Anticoagulants: The patient has                            taken no previous anticoagulant or antiplatelet                            agents. ASA Grade Assessment: II - A patient with                            mild systemic disease. After reviewing the risks                            and benefits, the patient was deemed in                            satisfactory condition to undergo the procedure.  After obtaining informed consent, the colonoscope                            was passed under direct vision. Throughout the                            procedure, the patient's blood pressure, pulse, and                            oxygen saturations were monitored continuously. The                            Colonoscope was introduced through the anus and                            advanced to the the terminal  ileum, with                            identification of the appendiceal orifice and IC                            valve. The colonoscopy was performed without                            difficulty. The patient tolerated the procedure                            well. The quality of the bowel preparation was                            good. The terminal ileum, ileocecal valve,                            appendiceal orifice, and rectum were photographed. Scope In: 2:24:06 PM Scope Out: 2:45:13 PM Scope Withdrawal Time: 0 hours 17 minutes 24 seconds  Total Procedure Duration: 0 hours 21 minutes 7 seconds  Findings:                 The perianal and digital rectal examinations were                            normal.                           The terminal ileum appeared normal.                           A 3-4 mm polyp was found in the sigmoid colon. The                            polyp was sessile. The polyp was removed with a                            cold snare. Resection and retrieval were complete.  A few small-mouthed diverticula were found in the                            sigmoid colon and descending colon.                           Internal hemorrhoids were found during retroflexion.                           The exam was otherwise without abnormality. Complications:            No immediate complications. Estimated blood loss:                            Minimal. Estimated Blood Loss:     Estimated blood loss was minimal. Impression:               - The examined portion of the ileum was normal.                           - One 3-4 mm polyp in the sigmoid colon, removed                            with a cold snare. Resected and retrieved.                           - Diverticulosis in the sigmoid colon and in the                            descending colon.                           - Internal hemorrhoids.                           - The examination was otherwise  normal.                           Positive stool test could have been a false                            positive, no clear pathology other than hemorrhoids                            to have caused this result. Recommendation:           - Patient has a contact number available for                            emergencies. The signs and symptoms of potential                            delayed complications were discussed with the                            patient. Return to normal activities  tomorrow.                            Written discharge instructions were provided to the                            patient.                           - Resume previous diet.                           - Continue present medications.                           - Await pathology results.                           - Repeat colonoscopy in 3 years for surveillance                            given large adenoma removed 1.5 years ago.                           - No further stool testing should be performed for                            this patient. His screening / surveillance should                            only be done by colonoscopy given history of                            adenomatous colon polyps Remo Lipps P. Armbruster MD, MD 03/27/2018 2:52:07 PM This report has been signed electronically.

## 2018-03-27 NOTE — Patient Instructions (Signed)
*  Handouts given on polyps, diverticulosis and hemorrhoids  YOU HAD AN ENDOSCOPIC PROCEDURE TODAY AT THE [facility]:   Refer to the procedure report that was given to you for any specific questions about what was found during the examination.  If the procedure report does not answer your questions, please call your gastroenterologist to clarify.  If you requested that your care partner not be given the details of your procedure findings, then the procedure report has been included in a sealed envelope for you to review at your convenience later.  YOU SHOULD EXPECT: Some feelings of bloating in the abdomen. Passage of more gas than usual.  Walking can help get rid of the air that was put into your GI tract during the procedure and reduce the bloating. If you had a lower endoscopy (such as a colonoscopy or flexible sigmoidoscopy) you may notice spotting of blood in your stool or on the toilet paper. If you underwent a bowel prep for your procedure, you may not have a normal bowel movement for a few days.  Please Note:  You might notice some irritation and congestion in your nose or some drainage.  This is from the oxygen used during your procedure.  There is no need for concern and it should clear up in a day or so.  SYMPTOMS TO REPORT IMMEDIATELY:   Following lower endoscopy (colonoscopy or flexible sigmoidoscopy):  Excessive amounts of blood in the stool  Significant tenderness or worsening of abdominal pains  Swelling of the abdomen that is new, acute  Fever of 100F or higher   For urgent or emergent issues, a gastroenterologist can be reached at any hour by calling [phone].   DIET:  We do recommend a small meal at first, but then you may proceed to your regular diet.  Drink plenty of fluids but you should avoid alcoholic beverages for 24 hours.  ACTIVITY:  You should plan to take it easy for the rest of today and you should NOT DRIVE or use heavy machinery until tomorrow  (because of the sedation medicines used during the test).    FOLLOW UP: Our staff will call the number listed on your records the next business day following your procedure to check on you and address any questions or concerns that you may have regarding the information given to you following your procedure. If we do not reach you, we will leave a message.  However, if you are feeling well and you are not experiencing any problems, there is no need to return our call.  We will assume that you have returned to your regular daily activities without incident.  If any biopsies were taken you will be contacted by phone or by letter within the next 1-3 weeks.  Please call us at [phone] if you have not heard about the biopsies in 3 weeks.    SIGNATURES/CONFIDENTIALITY: You and/or your care partner have signed paperwork which will be entered into your electronic medical record.  These signatures attest to the fact that that the information above on your After Visit Summary has been reviewed and is understood.  Full responsibility of the confidentiality of this discharge information lies with you and/or your care-partner. 

## 2018-03-27 NOTE — Progress Notes (Signed)
Spontaneous respirations throughout. VSS. Resting comfortably. To PACU on room air. Report to  RN. 

## 2018-03-28 ENCOUNTER — Telehealth: Payer: Self-pay

## 2018-03-28 NOTE — Telephone Encounter (Signed)
  Follow up Call-  Call back number 03/27/2018 10/17/2016  Post procedure Call Back phone  # [phone]  Permission to leave phone message Yes Yes  Some recent data might be hidden     Patient questions:  Do you have a fever, pain , or abdominal swelling? No. Pain Score  0 *  Have you tolerated food without any problems? Yes.    Have you been able to return to your normal activities? Yes.    Do you have any questions about your discharge instructions: Diet   No. Medications  No. Follow up visit  No.  Do you have questions or concerns about your Care? No.  Actions: * If pain score is 4 or above: No action needed, pain <4.

## 2018-04-09 ENCOUNTER — Encounter: Payer: Self-pay | Admitting: Gastroenterology

## 2018-10-05 ENCOUNTER — Other Ambulatory Visit (INDEPENDENT_AMBULATORY_CARE_PROVIDER_SITE_OTHER): Payer: Federal, State, Local not specified - PPO

## 2018-10-05 ENCOUNTER — Other Ambulatory Visit: Payer: Self-pay

## 2018-10-05 DIAGNOSIS — E559 Vitamin D deficiency, unspecified: Secondary | ICD-10-CM

## 2018-10-06 ENCOUNTER — Other Ambulatory Visit: Payer: Self-pay | Admitting: Adult Health

## 2018-10-06 LAB — VITAMIN D 25 HYDROXY (VIT D DEFICIENCY, FRACTURES): Vit D, 25-Hydroxy: 17.6 ng/mL — ABNORMAL LOW (ref 30.0–100.0)

## 2018-10-06 MED ORDER — VITAMIN D (ERGOCALCIFEROL) 1.25 MG (50000 UNIT) PO CAPS: 50000.0000 [IU] | ORAL_CAPSULE | ORAL | 0 refills | Status: DC

## 2018-10-08 ENCOUNTER — Other Ambulatory Visit: Payer: Self-pay

## 2018-10-08 DIAGNOSIS — E559 Vitamin D deficiency, unspecified: Secondary | ICD-10-CM

## 2019-01-07 DIAGNOSIS — J0100 Acute maxillary sinusitis, unspecified: Secondary | ICD-10-CM | POA: Diagnosis not present

## 2019-01-07 DIAGNOSIS — R05 Cough: Secondary | ICD-10-CM | POA: Diagnosis not present

## 2019-01-09 ENCOUNTER — Other Ambulatory Visit: Payer: Self-pay | Admitting: Adult Health

## 2019-01-16 ENCOUNTER — Other Ambulatory Visit: Payer: Self-pay | Admitting: Adult Health

## 2019-02-09 ENCOUNTER — Ambulatory Visit (INDEPENDENT_AMBULATORY_CARE_PROVIDER_SITE_OTHER): Payer: Federal, State, Local not specified - PPO

## 2019-02-09 ENCOUNTER — Ambulatory Visit (HOSPITAL_COMMUNITY)
Admission: EM | Admit: 2019-02-09 | Discharge: 2019-02-09 | Disposition: A | Discharge status: Home / Self Care | Payer: Federal, State, Local not specified - PPO | Attending: Family Medicine | Admitting: Family Medicine

## 2019-02-09 ENCOUNTER — Encounter (HOSPITAL_COMMUNITY): Payer: Self-pay | Admitting: Emergency Medicine

## 2019-02-09 DIAGNOSIS — R05 Cough: Secondary | ICD-10-CM

## 2019-02-09 DIAGNOSIS — J40 Bronchitis, not specified as acute or chronic: Secondary | ICD-10-CM

## 2019-02-09 MED ORDER — ALBUTEROL SULFATE HFA 108 (90 BASE) MCG/ACT IN AERS: 2.0000 | INHALATION_SPRAY | RESPIRATORY_TRACT | 1 refills | Status: DC | PRN

## 2019-02-09 MED ORDER — PREDNISONE 20 MG PO TABS: ORAL_TABLET | ORAL | 0 refills | Status: DC

## 2019-02-09 MED ORDER — BENZONATATE 100 MG PO CAPS: 100.0000 mg | ORAL_CAPSULE | Freq: Three times a day (TID) | ORAL | 0 refills | Status: DC

## 2019-02-09 NOTE — ED Provider Notes (Signed)
[name]    CSN: [id number] Arrival date & time: 02/09/19  1718     History   Chief Complaint Chief Complaint  Patient presents with  . URI    HPI Dennis Richard is a 53 y.o. male.   Is a 53 year old man who comes in with 6 weeks of upper respiratory symptoms.  This is his first Dennis Richard urgent care visit.  Patient is a smoker.  He is being seen with his 44 year old daughter who is presenting with a fever.  Patient works as a Dispensing optician.     Past Medical History:  Diagnosis Date  . Anemia    childhood  . Chicken pox   . GERD (gastroesophageal reflux disease)   . Umbilical hernia     Patient Active Problem List   Diagnosis Date Noted  . Heme positive stool 02/26/2018  . Hyperlipidemia 02/06/2018  . Screening for colon cancer 02/06/2018  . Screening for prostate cancer 02/06/2018  . Tobacco use disorder 02/06/2018  . Vitamin D deficiency 02/06/2018  . Healthcare maintenance 03/22/2016  . Acute appendicitis 11/17/2015    Past Surgical History:  Procedure Laterality Date  . ACHILLES TENDON REPAIR Right 2009   achilles rupture and repair  . APPENDECTOMY  11/17/2015  . COLONOSCOPY  2017  . INGUINAL HERNIA REPAIR Bilateral [place] x3  . LAPAROSCOPIC APPENDECTOMY N/A 11/17/2015   Procedure: APPENDECTOMY LAPAROSCOPIC;  Surgeon: Ralene Ok, MD;  Location: [facility];  Service: General;  Laterality: N/A;  . POLYPECTOMY    . TONSILLECTOMY    . WISDOM TOOTH EXTRACTION         Home Medications    Prior to Admission medications   Medication Sig Start Date End Date Taking? Authorizing Provider  albuterol (PROVENTIL HFA;VENTOLIN HFA) 108 (90 Base) MCG/ACT inhaler Inhale 2 puffs into the lungs every 4 (four) hours as needed for wheezing or shortness of breath (cough, shortness of breath or wheezing.). 02/09/19   Robyn Haber, MD  benzonatate (TESSALON) 100 MG capsule Take 1 capsule (100 mg total) by mouth every 8 (eight) hours.  02/09/19   Robyn Haber, MD  CINNAMON PO Take by mouth.    [provider]  Coenzyme Q10 (COQ10 PO) Take by mouth.    [provider]  ELDERBERRY PO Take by mouth.    [provider]  Multiple Vitamins-Minerals (MULTIVITAMIN ADULTS PO) Take by mouth.    [provider]  Na Sulfate-K Sulfate-Mg Sulf 17.5-3.13-1.6 GM/177ML SOLN Suprep (no substitutions)-TAKE AS DIRECTED. 03/20/18   Armbruster, Carlota Raspberry, MD  OVER THE COUNTER MEDICATION Take by mouth daily. Burdock root    [provider]  OVER THE COUNTER MEDICATION Take 1 tablet by mouth daily.    [provider]  predniSONE (DELTASONE) 20 MG tablet Two daily with food 02/09/19   Robyn Haber, MD  Probiotic Product (PROBIOTIC DAILY PO) Take by mouth.    [provider]  RED CLOVER LEAF EXTRACT PO Take by mouth.    [provider]  Specialty Vitamins Products (BILBERRY PLUS PO) Take by mouth.    [provider]  TURMERIC PO Take by mouth.    [provider]  Vitamin D, Ergocalciferol, (DRISDOL) 50000 units CAPS capsule Take 1 capsule (50,000 Units total) by mouth every 7 (seven) days. 10/06/18   Danford, Valetta Fuller D, NP  Zn-Pyg Afri-Nettle-Saw Palmet (SAW PALMETTO COMPLEX PO) Take by mouth. Pomegranate, grape seed oil    [provider]  Family History Family History  Problem Relation Age of Onset  . Hyperlipidemia Mother   . Hypertension Mother   . Hyperlipidemia Father   . Diabetes Father   . Hypertension Father   . Heart disease Maternal Grandmother   . Heart disease Paternal Grandmother   . Diabetes Paternal Grandmother   . Hyperlipidemia Paternal Grandfather   . Hypertension Paternal Grandfather   . Colon cancer Neg Hx     Social History Social History   Tobacco Use  . Smoking status: Current Every Day Smoker    Packs/day: 0.25    Years: 33.00    Pack years: 8.25    Types: Cigarettes  . Smokeless tobacco: Former Systems developer     Types: Snuff, Chew  Substance Use Topics  . Alcohol use: Yes    Alcohol/week: 13.0 standard drinks    Types: 7 Glasses of wine, 6 Cans of beer per week  . Drug use: No     Allergies   Patient has no known allergies.   Review of Systems Review of Systems   Physical Exam Triage Vital Signs ED Triage Vitals [02/09/19 1729]  Enc Vitals Group     BP 132/72     Pulse Rate 83     Resp 18     Temp (!) 97.2 F (36.2 C)     Temp Source Temporal     SpO2 96 %     Weight      Height      Head Circumference      Peak Flow      Pain Score 0     Pain Loc      Pain Edu?      Excl. in [place]?    No data found.  Updated Vital Signs BP 132/72 (BP Location: Right Arm)   Pulse 83   Temp (!) 97.2 F (36.2 C) (Temporal)   Resp 18   SpO2 96%    Physical Exam Vitals signs and nursing note reviewed.  Constitutional:      Appearance: Normal appearance.  HENT:     Head: Normocephalic and atraumatic.     Right Ear: Tympanic membrane and external ear normal.     Left Ear: Tympanic membrane and external ear normal.     Nose: Nose normal.     Mouth/Throat:     Mouth: Mucous membranes are moist.  Eyes:     Conjunctiva/sclera: Conjunctivae normal.     Pupils: Pupils are equal, round, and reactive to light.  Neck:     Musculoskeletal: Normal range of motion and neck supple.  Cardiovascular:     Rate and Rhythm: Normal rate and regular rhythm.     Heart sounds: Normal heart sounds.  Pulmonary:     Effort: Pulmonary effort is normal.     Breath sounds: Rhonchi present.  Musculoskeletal: Normal range of motion.  Skin:    General: Skin is warm and dry.  Neurological:     General: No focal deficit present.     Mental Status: He is alert and oriented to person, place, and time.  Psychiatric:        Mood and Affect: Mood normal.      UC Treatments / Results  Labs (all labs ordered are listed, but only abnormal results are displayed) Labs Reviewed - No data to  display  EKG None  Radiology Chest x-ray shows no masses, no diaphragm changes, no infiltrates`    Procedures Procedures (including critical care time)  Medications  Ordered in UC Medications - No data to display  Initial Impression / Assessment and Plan / UC Course  I have reviewed the triage vital signs and the nursing notes.  Pertinent labs & imaging results that were available during my care of the patient were reviewed by me and considered in my medical decision making (see chart for details).  Clinical Course as of Feb 09 1813  Sat Feb 09, 2019  1809 Temp(!): 97.2 F (36.2 C) [KL]    Clinical Course User Index [KL] Robyn Haber, MD   Final Clinical Impressions(s) / UC Diagnoses   Final diagnoses:  Bronchitis     Discharge Instructions     Your chest x-ray does not show pneumonia or serious lung disease.  Instead it is most consistent with bronchitis.  For that reason we are prescribing an inhaler and some bronchial anti-inflammatory medicine.    ED Prescriptions    Medication Sig Dispense Auth. Provider   benzonatate (TESSALON) 100 MG capsule Take 1 capsule (100 mg total) by mouth every 8 (eight) hours. 21 capsule Robyn Haber, MD   predniSONE (DELTASONE) 20 MG tablet Two daily with food 10 tablet Robyn Haber, MD   albuterol (PROVENTIL HFA;VENTOLIN HFA) 108 (90 Base) MCG/ACT inhaler Inhale 2 puffs into the lungs every 4 (four) hours as needed for wheezing or shortness of breath (cough, shortness of breath or wheezing.). [place], MD     Controlled Substance Prescriptions [place] Controlled Substance Registry consulted? Not Applicable   Robyn Haber, MD 02/09/19 [phone]

## 2019-02-09 NOTE — Discharge Instructions (Signed)
Your chest x-ray does not show pneumonia or serious lung disease.  Instead it is most consistent with bronchitis.  For that reason we are prescribing an inhaler and some bronchial anti-inflammatory medicine.

## 2019-02-09 NOTE — ED Triage Notes (Signed)
Pt sts URI sx x 6 weeks

## 2019-02-12 NOTE — Progress Notes (Signed)
Subjective:    Patient ID: Dennis Richard, male    DOB: 12/30/1965, 53 y.o.   MRN: [id number]  HPI:  [name] is here for CPE He has been acutely ill with sinusitis and URI the last last 6 weeks- First treated with 10 day course of Amoxicillin for sinusitis Then Prednisone/Tessalon/Albuterol for bronchitis, has one day left of 5 day course of Prednisone. He reports continued non-productive cough and fatigue.  He reports 50% reduction of sx's since last round of medication. He continues to smoke 1/2 pack per day He denies regular exercise outside of activity of [employer] work He has been trying to reduce saturated fat in diet  He needs to schedule an appt for fasting labs obtained today  Healthcare Maintenance: Colonoscopy-UTD, last 03/27/18, repeat 10 years Immunizations-declined zoster  Patient Care Team    Relationship Specialty Notifications Start End  Esaw Grandchild, NP PCP - General Family Medicine  01/10/18   Armbruster, Carlota Raspberry, MD Consulting Physician Gastroenterology  01/10/18     Patient Active Problem List   Diagnosis Date Noted  . Heme positive stool 02/26/2018  . Hyperlipidemia 02/06/2018  . Screening for colon cancer 02/06/2018  . Screening for prostate cancer 02/06/2018  . Tobacco use disorder 02/06/2018  . Vitamin D deficiency 02/06/2018  . Healthcare maintenance 03/22/2016  . Acute appendicitis 11/17/2015     Past Medical History:  Diagnosis Date  . Anemia    childhood  . Chicken pox   . GERD (gastroesophageal reflux disease)   . Umbilical hernia      Past Surgical History:  Procedure Laterality Date  . ACHILLES TENDON REPAIR Right 2009   achilles rupture and repair  . APPENDECTOMY  11/17/2015  . COLONOSCOPY  2017  . INGUINAL HERNIA REPAIR Bilateral [place] x3  . LAPAROSCOPIC APPENDECTOMY N/A 11/17/2015   Procedure: APPENDECTOMY LAPAROSCOPIC;  Surgeon: Ralene Ok, MD;  Location: [facility];  Service: General;  Laterality: N/A;  .  POLYPECTOMY    . TONSILLECTOMY    . WISDOM TOOTH EXTRACTION       Family History  Problem Relation Age of Onset  . Hyperlipidemia Mother   . Hypertension Mother   . Hyperlipidemia Father   . Diabetes Father   . Hypertension Father   . Heart disease Maternal Grandmother   . Heart disease Paternal Grandmother   . Diabetes Paternal Grandmother   . Hyperlipidemia Paternal Grandfather   . Hypertension Paternal Grandfather   . Colon cancer Neg Hx      Social History   Substance and Sexual Activity  Drug Use No     Social History   Substance and Sexual Activity  Alcohol Use Yes  . Alcohol/week: 13.0 standard drinks  . Types: 7 Glasses of wine, 6 Cans of beer per week     Social History   Tobacco Use  Smoking Status Current Every Day Smoker  . Packs/day: 0.25  . Years: 33.00  . Pack years: 8.25  . Types: Cigarettes  Smokeless Tobacco Former Systems developer  . Types: Snuff, Chew     Outpatient Encounter Medications as of 02/13/2019  Medication Sig  . albuterol (PROVENTIL HFA;VENTOLIN HFA) 108 (90 Base) MCG/ACT inhaler Inhale 2 puffs into the lungs every 4 (four) hours as needed for wheezing or shortness of breath (cough, shortness of breath or wheezing.).  Marland Kitchen benzonatate (TESSALON) 100 MG capsule Take 1 capsule (100 mg total) by mouth every 8 (eight) hours.  Marland Kitchen CINNAMON PO Take  by mouth.  . Coenzyme Q10 (COQ10 PO) Take by mouth.  . ELDERBERRY PO Take by mouth.  . Multiple Vitamins-Minerals (MULTIVITAMIN ADULTS PO) Take by mouth.  . Na Sulfate-K Sulfate-Mg Sulf 17.5-3.13-1.6 GM/177ML SOLN Suprep (no substitutions)-TAKE AS DIRECTED.  Marland Kitchen OVER THE COUNTER MEDICATION Take by mouth daily. Burdock root  . OVER THE COUNTER MEDICATION Take 1 tablet by mouth daily.  . predniSONE (DELTASONE) 20 MG tablet Two daily with food  . Probiotic Product (PROBIOTIC DAILY PO) Take by mouth.  . RED CLOVER LEAF EXTRACT PO Take by mouth.  Marland Kitchen Specialty Vitamins Products (BILBERRY PLUS PO) Take by  mouth.  . TURMERIC PO Take by mouth.  . Vitamin D, Ergocalciferol, (DRISDOL) 50000 units CAPS capsule Take 1 capsule (50,000 Units total) by mouth every 7 (seven) days.  Marland Kitchen Zn-Pyg Afri-Nettle-Saw Palmet (SAW PALMETTO COMPLEX PO) Take by mouth. Pomegranate, grape seed oil  . azithromycin (ZITHROMAX) 250 MG tablet 2 tabs days one. 1 tab days two-five   Facility-Administered Encounter Medications as of 02/13/2019  Medication  . 0.9 %  sodium chloride infusion    Allergies: Patient has no known allergies.  Body mass index is 30.82 kg/m.  Blood pressure 119/79, pulse 76, temperature 98.8 F (37.1 C), temperature source Oral, height 5' 6.75" (1.695 m), weight 195 lb 4.8 oz (88.6 kg), SpO2 96 %.     Review of Systems  Constitutional: Positive for fatigue. Negative for activity change, appetite change, chills, diaphoresis, fever and unexpected weight change.  HENT: Negative for congestion.   Eyes: Negative for visual disturbance.  Respiratory: Positive for cough, shortness of breath and wheezing.   Cardiovascular: Negative for chest pain, palpitations and leg swelling.  Gastrointestinal: Negative for abdominal distention, anal bleeding, blood in stool, constipation, diarrhea, nausea and vomiting.  Endocrine: Negative for cold intolerance, heat intolerance, polydipsia, polyphagia and polyuria.  Genitourinary: Negative for difficulty urinating, flank pain, hematuria and urgency.  Musculoskeletal: Negative for arthralgias, back pain, gait problem, joint swelling, myalgias, neck pain and neck stiffness.  Skin: Negative for color change, pallor, rash and wound.  Allergic/Immunologic: Negative for immunocompromised state.  Neurological: Negative for dizziness and headaches.  Hematological: Does not bruise/bleed easily.  Psychiatric/Behavioral: Negative for agitation, behavioral problems, confusion, decreased concentration, dysphoric mood, hallucinations, self-injury, sleep disturbance and  suicidal ideas. The patient is not nervous/anxious and is not hyperactive.        Objective:   Physical Exam Vitals signs and nursing note reviewed. Exam conducted with a chaperone present.  Constitutional:      General: He is not in acute distress.    Appearance: He is normal weight. He is not ill-appearing, toxic-appearing or diaphoretic.  HENT:     Head: Normocephalic and atraumatic.     Right Ear: Tympanic membrane, ear canal and external ear normal. There is no impacted cerumen.     Left Ear: Tympanic membrane, ear canal and external ear normal. There is no impacted cerumen.     Nose: Congestion and rhinorrhea present.     Mouth/Throat:     Mouth: Mucous membranes are moist.     Pharynx: Posterior oropharyngeal erythema present. No oropharyngeal exudate.  Eyes:     Extraocular Movements: Extraocular movements intact.     Conjunctiva/sclera: Conjunctivae normal.     Pupils: Pupils are equal, round, and reactive to light.  Neck:     Musculoskeletal: Normal range of motion.  Cardiovascular:     Rate and Rhythm: Normal rate.     Pulses: Normal pulses.  Heart sounds: Normal heart sounds. No murmur. No friction rub. No gallop.   Pulmonary:     Breath sounds: Examination of the right-upper field reveals wheezing. Examination of the left-upper field reveals wheezing. Examination of the right-middle field reveals wheezing. Examination of the left-middle field reveals wheezing. Wheezing present. No decreased breath sounds.  Abdominal:     General: Abdomen is flat. Bowel sounds are normal. There is no distension.     Palpations: There is no mass.     Tenderness: There is no abdominal tenderness. There is no right CVA tenderness, left CVA tenderness, guarding or rebound.     Hernia: No hernia is present.  Genitourinary:    Rectum: Normal. Guaiac result negative.     Comments: Chaperone present during examination. Lymphadenopathy:     Cervical: No cervical adenopathy.  Skin:     General: Skin is warm and dry.     Capillary Refill: Capillary refill takes less than 2 seconds.  Neurological:     Mental Status: He is alert and oriented to person, place, and time.  Psychiatric:        Attention and Perception: Attention and perception normal.        Mood and Affect: Mood is anxious.        Speech: Speech normal.        Behavior: Behavior normal.        Thought Content: Thought content normal.        Judgment: Judgment normal.       Assessment & Plan:   1. Healthcare maintenance   2. Hyperlipidemia, unspecified hyperlipidemia type   3. Screening for colon cancer     Healthcare maintenance Please schedule fasting lab appt in the next 1-2 weeks. Please take Azithromycin as directed to help clear bronchitis. Reduce to stop tobacco use- YOU CAN DO IT! Continued tobacco use will increase likelihood of re-current respiratory infections. Remain well hydrated and follow heart healthy diet. In addition activity at work, increase regular exercise. Recommend at least 30 minutes daily, 5 days per week of walking, jogging, biking, swimming, YouTube/Pinterest workout videos.  Hyperlipidemia Please schedule fasting lab appt in the next 1-2 weeks.   Screening for colon cancer Occult negative     FOLLOW-UP:  Return in about 1 year (around 02/14/2020) for CPE.

## 2019-02-13 ENCOUNTER — Ambulatory Visit (INDEPENDENT_AMBULATORY_CARE_PROVIDER_SITE_OTHER): Payer: Federal, State, Local not specified - PPO | Admitting: Adult Health

## 2019-02-13 ENCOUNTER — Encounter: Payer: Self-pay | Admitting: Adult Health

## 2019-02-13 VITALS — BP 119/79 | HR 76 | Temp 98.8°F | Ht 66.75 in | Wt 195.3 lb

## 2019-02-13 DIAGNOSIS — Z0000 Encounter for general adult medical examination without abnormal findings: Secondary | ICD-10-CM

## 2019-02-13 DIAGNOSIS — E785 Hyperlipidemia, unspecified: Secondary | ICD-10-CM | POA: Diagnosis not present

## 2019-02-13 DIAGNOSIS — Z1211 Encounter for screening for malignant neoplasm of colon: Secondary | ICD-10-CM | POA: Diagnosis not present

## 2019-02-13 LAB — POC HEMOCCULT BLD/STL (OFFICE/1-CARD/DIAGNOSTIC): FECAL OCCULT BLD: NEGATIVE

## 2019-02-13 MED ORDER — AZITHROMYCIN 250 MG PO TABS: ORAL_TABLET | ORAL | 0 refills | Status: DC

## 2019-02-13 NOTE — Assessment & Plan Note (Signed)
Please schedule fasting lab appt in the next 1-2 weeks.

## 2019-02-13 NOTE — Assessment & Plan Note (Signed)
Please schedule fasting lab appt in the next 1-2 weeks. Please take Azithromycin as directed to help clear bronchitis. Reduce to stop tobacco use- YOU CAN DO IT! Continued tobacco use will increase likelihood of re-current respiratory infections. Remain well hydrated and follow heart healthy diet. In addition activity at work, increase regular exercise. Recommend at least 30 minutes daily, 5 days per week of walking, jogging, biking, swimming, YouTube/Pinterest workout videos.

## 2019-02-13 NOTE — Assessment & Plan Note (Signed)
Occult negative

## 2019-02-13 NOTE — Patient Instructions (Addendum)

## 2019-02-15 ENCOUNTER — Encounter: Payer: Self-pay | Admitting: Adult Health

## 2019-02-15 ENCOUNTER — Other Ambulatory Visit: Payer: Federal, State, Local not specified - PPO

## 2019-02-15 DIAGNOSIS — E785 Hyperlipidemia, unspecified: Secondary | ICD-10-CM

## 2019-02-15 DIAGNOSIS — Z0000 Encounter for general adult medical examination without abnormal findings: Secondary | ICD-10-CM | POA: Diagnosis not present

## 2019-02-15 DIAGNOSIS — E559 Vitamin D deficiency, unspecified: Secondary | ICD-10-CM | POA: Diagnosis not present

## 2019-02-16 LAB — COMPREHENSIVE METABOLIC PANEL
ALT: 14 IU/L (ref 0–44)
AST: 15 IU/L (ref 0–40)
Albumin/Globulin Ratio: 2.3 — ABNORMAL HIGH (ref 1.2–2.2)
Albumin: 4.4 g/dL (ref 3.8–4.9)
Alkaline Phosphatase: 54 IU/L (ref 39–117)
BUN/Creatinine Ratio: 13 (ref 9–20)
BUN: 13 mg/dL (ref 6–24)
Bilirubin Total: 0.2 mg/dL (ref 0.0–1.2)
CO2: 22 mmol/L (ref 20–29)
Calcium: 9 mg/dL (ref 8.7–10.2)
Chloride: 101 mmol/L (ref 96–106)
Creatinine, Ser: 0.97 mg/dL (ref 0.76–1.27)
GFR calc Af Amer: 103 mL/min/{1.73_m2} (ref >59)
GFR calc non Af Amer: 89 mL/min/{1.73_m2} (ref >59)
Globulin, Total: 1.9 g/dL (ref 1.5–4.5)
Glucose: 88 mg/dL (ref 65–99)
POTASSIUM: 4.4 mmol/L (ref 3.5–5.2)
Sodium: 141 mmol/L (ref 134–144)
Total Protein: 6.3 g/dL (ref 6.0–8.5)

## 2019-02-16 LAB — CBC WITH DIFFERENTIAL/PLATELET
Basophils Absolute: 0 10*3/uL (ref 0.0–0.2)
Basos: 1 %
EOS (ABSOLUTE): 0.1 10*3/uL (ref 0.0–0.4)
Eos: 2 %
Hematocrit: 44.6 % (ref 37.5–51.0)
Hemoglobin: 15.5 g/dL (ref 13.0–17.7)
Immature Grans (Abs): 0 10*3/uL (ref 0.0–0.1)
Immature Granulocytes: 0 %
LYMPHS: 44 %
Lymphocytes Absolute: 3.9 10*3/uL — ABNORMAL HIGH (ref 0.7–3.1)
MCH: 33.8 pg — ABNORMAL HIGH (ref 26.6–33.0)
MCHC: 34.8 g/dL (ref 31.5–35.7)
MCV: 97 fL (ref 79–97)
Monocytes Absolute: 0.7 10*3/uL (ref 0.1–0.9)
Monocytes: 8 %
Neutrophils Absolute: 3.9 10*3/uL (ref 1.4–7.0)
Neutrophils: 45 %
Platelets: 273 10*3/uL (ref 150–450)
RBC: 4.58 x10E6/uL (ref 4.14–5.80)
RDW: 12.5 % (ref 11.6–15.4)
WBC: 8.8 10*3/uL (ref 3.4–10.8)

## 2019-02-16 LAB — TSH: TSH: 2.05 u[IU]/mL (ref 0.450–4.500)

## 2019-02-16 LAB — VITAMIN D 25 HYDROXY (VIT D DEFICIENCY, FRACTURES): Vit D, 25-Hydroxy: 24.5 ng/mL — ABNORMAL LOW (ref 30.0–100.0)

## 2019-02-16 LAB — LIPID PANEL
Chol/HDL Ratio: 4.5 ratio (ref 0.0–5.0)
Cholesterol, Total: 229 mg/dL — ABNORMAL HIGH (ref 100–199)
HDL: 51 mg/dL (ref >39)
LDL CALC: 139 mg/dL — AB (ref 0–99)
Triglycerides: 194 mg/dL — ABNORMAL HIGH (ref 0–149)
VLDL Cholesterol Cal: 39 mg/dL (ref 5–40)

## 2019-02-16 LAB — HEMOGLOBIN A1C
Est. average glucose Bld gHb Est-mCnc: 114 mg/dL
Hgb A1c MFr Bld: 5.6 % (ref 4.8–5.6)

## 2019-02-17 ENCOUNTER — Other Ambulatory Visit: Payer: Self-pay | Admitting: Adult Health

## 2019-02-17 DIAGNOSIS — E559 Vitamin D deficiency, unspecified: Secondary | ICD-10-CM

## 2019-02-17 MED ORDER — VITAMIN D (ERGOCALCIFEROL) 1.25 MG (50000 UNIT) PO CAPS: 50000.0000 [IU] | ORAL_CAPSULE | ORAL | 0 refills | Status: DC

## 2019-04-04 IMAGING — DX DG CHEST 2V
2 series · 2 of 2 positions shown · non-contrast
Comparison: None.

CLINICAL DATA: Cough for 6 weeks.

EXAM:
CHEST - 2 VIEW

[chest pa]
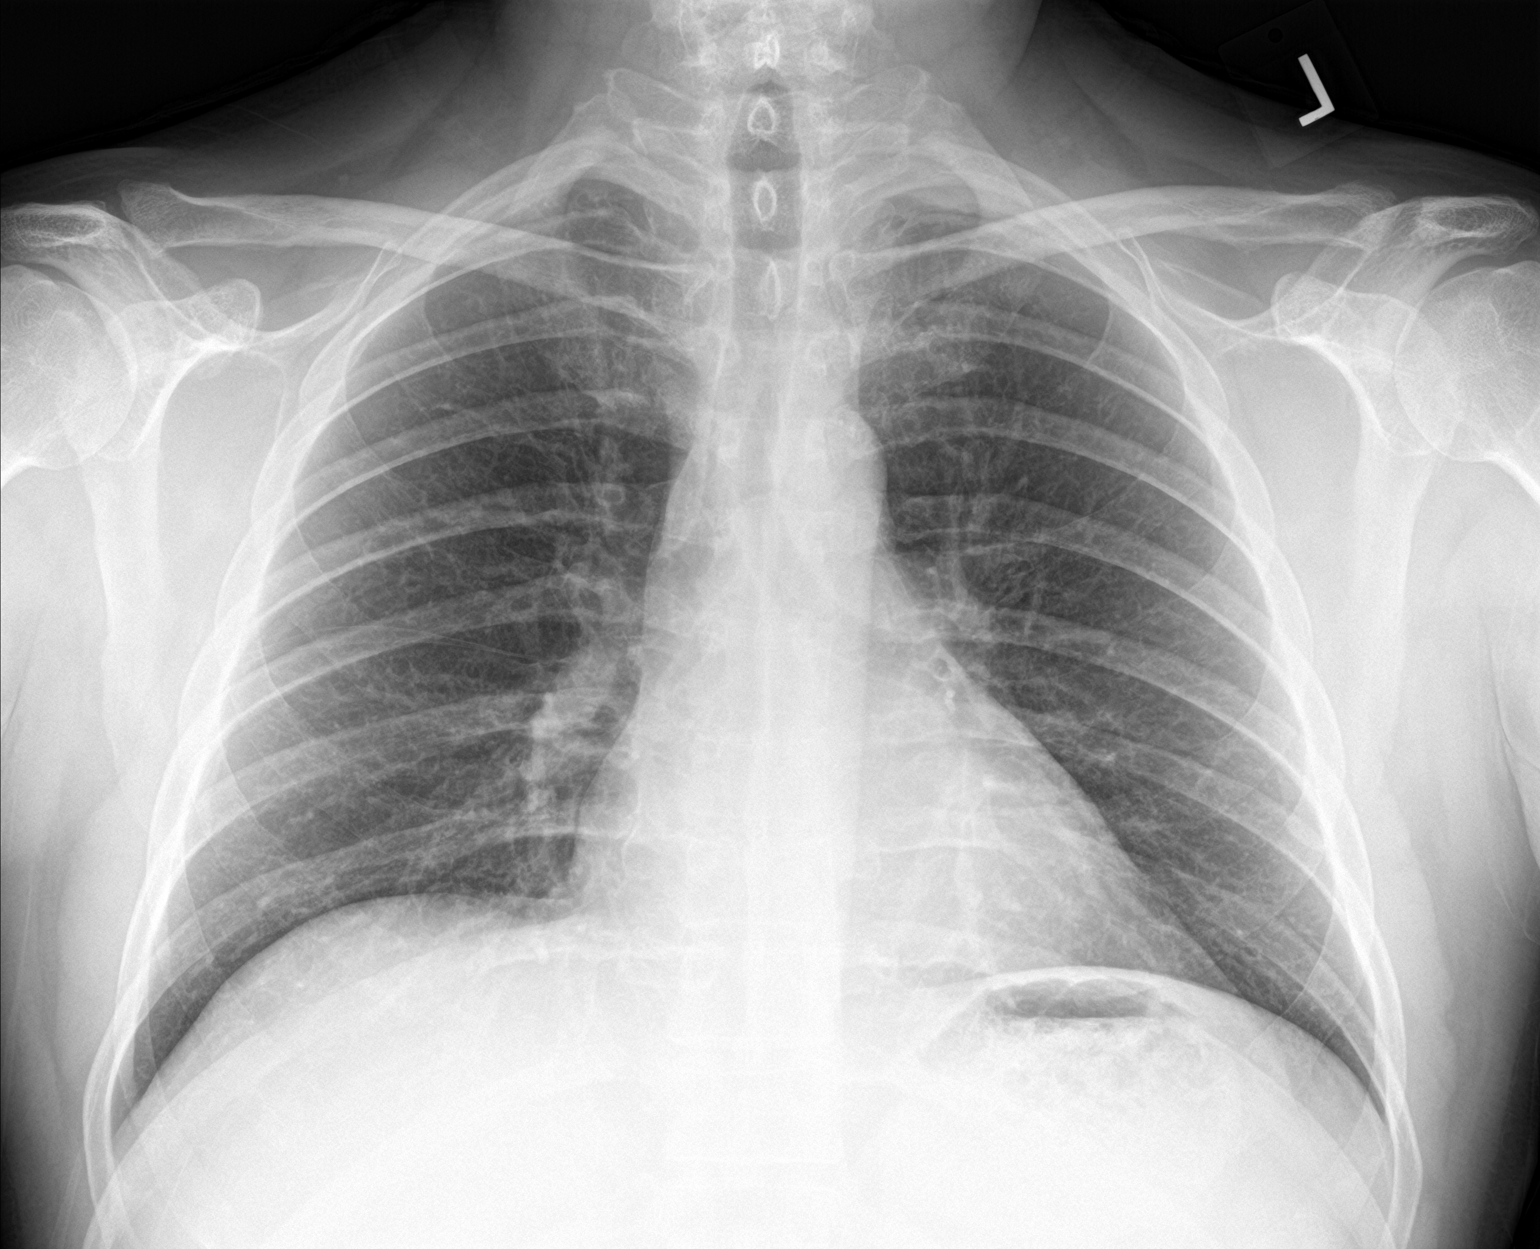

[chest lat]
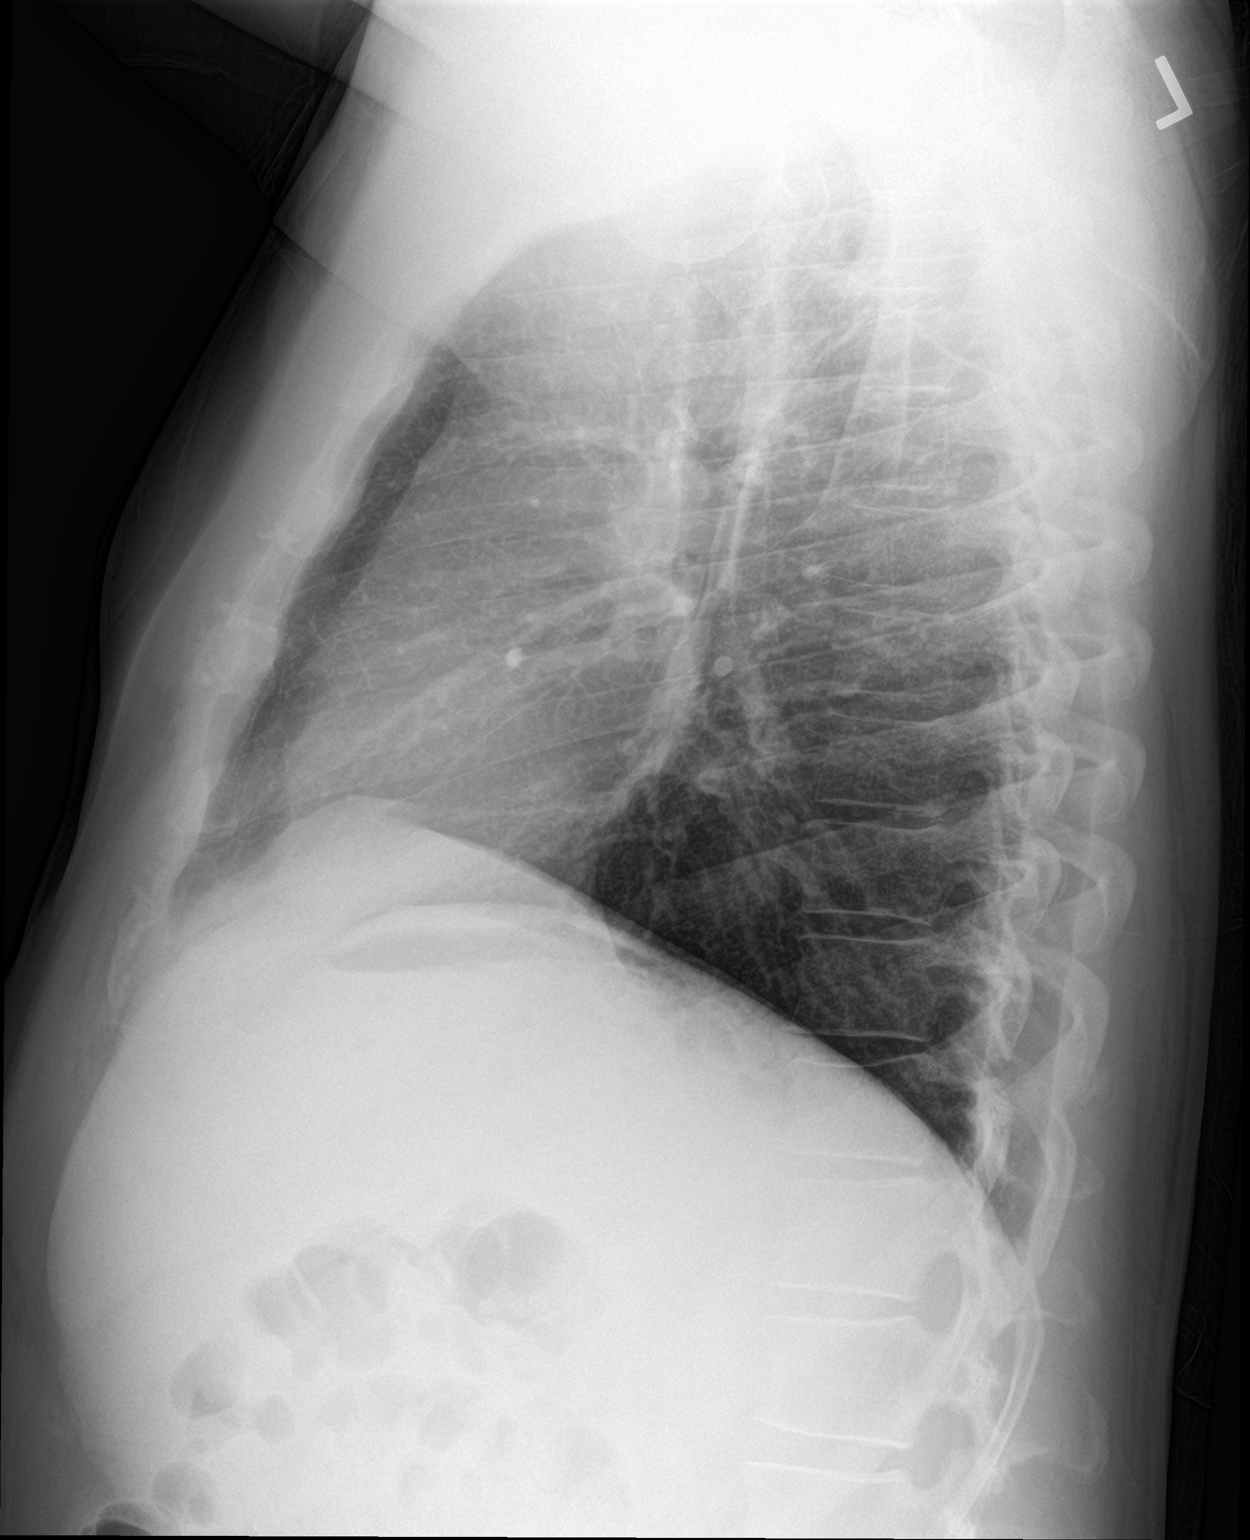

[2 of 2 positions shown; findings below may reference images not displayed]

FINDINGS: Cardiomediastinal silhouette is normal. Mediastinal contours appear
intact.

There is no evidence of focal airspace consolidation, pleural
effusion or pneumothorax.

Osseous structures are without acute abnormality. Soft tissues are
grossly normal.
IMPRESSION: No active cardiopulmonary disease.

## 2019-05-11 ENCOUNTER — Other Ambulatory Visit: Payer: Self-pay | Admitting: Adult Health

## 2019-12-24 DIAGNOSIS — Z20822 Contact with and (suspected) exposure to covid-19: Secondary | ICD-10-CM | POA: Diagnosis not present

## 2019-12-24 DIAGNOSIS — Z20828 Contact with and (suspected) exposure to other viral communicable diseases: Secondary | ICD-10-CM | POA: Diagnosis not present

## 2020-03-31 DIAGNOSIS — J341 Cyst and mucocele of nose and nasal sinus: Secondary | ICD-10-CM | POA: Diagnosis not present

## 2020-04-09 DIAGNOSIS — J341 Cyst and mucocele of nose and nasal sinus: Secondary | ICD-10-CM | POA: Diagnosis not present

## 2021-04-15 ENCOUNTER — Encounter: Payer: Self-pay | Admitting: Gastroenterology

## 2021-12-15 DIAGNOSIS — U071 COVID-19: Secondary | ICD-10-CM | POA: Diagnosis not present

## 2021-12-15 DIAGNOSIS — Z20822 Contact with and (suspected) exposure to covid-19: Secondary | ICD-10-CM | POA: Diagnosis not present

## 2022-12-21 DIAGNOSIS — J09X2 Influenza due to identified novel influenza A virus with other respiratory manifestations: Secondary | ICD-10-CM | POA: Diagnosis not present

## 2023-06-14 ENCOUNTER — Ambulatory Visit: Payer: Federal, State, Local not specified - PPO | Admitting: Family Medicine

## 2023-06-14 ENCOUNTER — Encounter: Payer: Self-pay | Admitting: Family Medicine

## 2023-06-14 VITALS — BP 119/70 | HR 67 | Temp 98.6°F | Ht 60.0 in | Wt 194.0 lb

## 2023-06-14 DIAGNOSIS — D126 Benign neoplasm of colon, unspecified: Secondary | ICD-10-CM

## 2023-06-14 DIAGNOSIS — R2232 Localized swelling, mass and lump, left upper limb: Secondary | ICD-10-CM

## 2023-06-14 DIAGNOSIS — E785 Hyperlipidemia, unspecified: Secondary | ICD-10-CM

## 2023-06-14 DIAGNOSIS — F17200 Nicotine dependence, unspecified, uncomplicated: Secondary | ICD-10-CM

## 2023-06-14 DIAGNOSIS — Z7689 Persons encountering health services in other specified circumstances: Secondary | ICD-10-CM

## 2023-06-14 DIAGNOSIS — R7303 Prediabetes: Secondary | ICD-10-CM | POA: Insufficient documentation

## 2023-06-14 LAB — POCT GLYCOSYLATED HEMOGLOBIN (HGB A1C): Hemoglobin A1C: 5.8 % — AB (ref 4.0–5.6)

## 2023-06-14 NOTE — Assessment & Plan Note (Signed)
Pt has no desire to quit today. Discussed low dose annual ct chest screenings.  Pt wishes to contact insurance first prior to ordering this.

## 2023-06-14 NOTE — Progress Notes (Unsigned)
New Patient Office Visit  Subjective    Patient ID: Dennis Richard, male    DOB: 1966/11/19  Age: 57 y.o. MRN: [id number]  CC:  Chief Complaint  Patient presents with   New Patient (Initial Visit)    HPI Dennis Richard presents to establish care  Patient states that his wife wanted him to get a physical. He has no significant medical history.  No concerns at the moment other than the pain in his left wrist from a cyst.  Patient states that for the past few months he has had a "cyst" on the anterior side of his left wrist.  Feels like it moves locations.  Is painful to touch and sometimes causes numbness tingling in his hands.  No rash or discharge.  Discussed with patient treatment options including surgical removal.  Patient wants to hold off on surgery referral at this time.  Discussed the patient's smoking status and low-dose CT scans for lung cancer screening.  Patient wants to check with his insurance company to see if they cover it before he has 1 done.  Patient has no desire to quit at this time.  Discussed patient's colonoscopy from 2019.  Recommended that he follow-up with a another 1 to 1.5 years after that due to the first colonoscopy showing an adenoma.  Discussed with the patient.  Advised him to reach out to his gastroenterologist who performed it and schedule a another colonoscopy.  PMH: na  PSH: hernia repair x 3, achilles, tonsillectomy, appendectomy  FH: dm - father.    Tobacco use: cigarettes - 1/2 ppd.  Since 57 yo.   Alcohol use: 1x wine/day, beer on weekends .  Drug use: no Marital status: married.  Children: 16, 4, 22 yo.   Employment: works at the post [employer].       The ASCVD Risk score (Arnett DK, et al., 2019) failed to calculate for the following reasons:   Cannot find a previous HDL lab   Cannot find a previous total cholesterol lab    Outpatient Encounter Medications as of 06/14/2023  Medication Sig   CINNAMON PO Take by mouth.   Coenzyme  Q10 (COQ10 PO) Take by mouth.   ELDERBERRY PO Take by mouth.   Multiple Vitamins-Minerals (MULTIVITAMIN ADULTS PO) Take by mouth.   Na Sulfate-K Sulfate-Mg Sulf 17.5-3.13-1.6 GM/177ML SOLN Suprep (no substitutions)-TAKE AS DIRECTED.   OVER THE COUNTER MEDICATION Take by mouth daily. Burdock root   OVER THE COUNTER MEDICATION Take 1 tablet by mouth daily.   predniSONE (DELTASONE) 20 MG tablet Two daily with food   Probiotic Product (PROBIOTIC DAILY PO) Take by mouth.   RED CLOVER LEAF EXTRACT PO Take by mouth.   Specialty Vitamins Products (BILBERRY PLUS PO) Take by mouth.   TURMERIC PO Take by mouth.   Vitamin D, Ergocalciferol, (DRISDOL) 1.25 MG (50000 UT) CAPS capsule Take 1 capsule (50,000 Units total) by mouth every 7 (seven) days.   Zn-Pyg Afri-Nettle-Saw Palmet (SAW PALMETTO COMPLEX PO) Take by mouth. Pomegranate, grape seed oil   albuterol (PROVENTIL HFA;VENTOLIN HFA) 108 (90 Base) MCG/ACT inhaler Inhale 2 puffs into the lungs every 4 (four) hours as needed for wheezing or shortness of breath (cough, shortness of breath or wheezing.). (Patient not taking: Reported on 06/14/2023)   azithromycin (ZITHROMAX) 250 MG tablet 2 tabs days one. 1 tab days two-five (Patient not taking: Reported on 06/14/2023)   benzonatate (TESSALON) 100 MG capsule Take 1 capsule (100 mg total) by mouth every  8 (eight) hours. (Patient not taking: Reported on 06/14/2023)   Facility-Administered Encounter Medications as of 06/14/2023  Medication   0.9 %  sodium chloride infusion    Past Medical History:  Diagnosis Date   Acute appendicitis 11/17/2015   Anemia    childhood   Chicken pox    GERD (gastroesophageal reflux disease)    Umbilical hernia     Past Surgical History:  Procedure Laterality Date   ACHILLES TENDON REPAIR Right 2009   achilles rupture and repair   APPENDECTOMY  11/17/2015   COLONOSCOPY  2017   INGUINAL HERNIA REPAIR Bilateral 1995   [place] x3   LAPAROSCOPIC APPENDECTOMY N/A  11/17/2015   Procedure: APPENDECTOMY LAPAROSCOPIC;  Surgeon: Axel Filler, MD;  Location: [facility];  Service: General;  Laterality: N/A;   POLYPECTOMY     TONSILLECTOMY     WISDOM TOOTH EXTRACTION      Family History  Problem Relation Age of Onset   Hyperlipidemia Mother    Hypertension Mother    Hyperlipidemia Father    Diabetes Father    Hypertension Father    Heart disease Maternal Grandmother    Heart disease Paternal Grandmother    Diabetes Paternal Grandmother    Hyperlipidemia Paternal Grandfather    Hypertension Paternal Grandfather    Colon cancer Neg Hx     Social History   Socioeconomic History   Marital status: Married    Spouse name: Not on file   Number of children: 2   Years of education: 14   Highest education level: Not on file  Occupational History   Occupation: [employer]   Tobacco Use   Smoking status: Every Day    Packs/day: 0.25    Years: 33.00    Additional pack years: 0.00    Total pack years: 8.25    Types: Cigarettes   Smokeless tobacco: Former    Types: Snuff, Chew  Vaping Use   Vaping Use: Never used  Substance and Sexual Activity   Alcohol use: Yes    Alcohol/week: 13.0 standard drinks of alcohol    Types: 7 Glasses of wine, 6 Cans of beer per week   Drug use: No   Sexual activity: Yes    Birth control/protection: None  Other Topics Concern   Not on file  Social History Narrative   Fun: Play music and ride 4 wheelers   Social Determinants of Health   Financial Resource Strain: Not on file  Food Insecurity: Not on file  Transportation Needs: Not on file  Physical Activity: Not on file  Stress: Not on file  Social Connections: Not on file  Intimate Partner Violence: Not on file    ROS      Objective    BP 119/70   Pulse 67   Temp 98.6 F (37 C) (Oral)   Ht 5' (1.524 m)   Wt 194 lb (88 kg)   SpO2 97%   BMI 37.89 kg/m   Physical Exam General: Alert, oriented H&T: PERRLA, EOMI, normal TM CV: Regular rate  and rhythm Pulmonary: Lungs clear bilaterally GI: Soft, normal bowel sounds MSK: Strength equal bilaterally.  Small subcutaneous nodule on the anterior aspect of the wrist on the left arm near the distal radius Extremities: No pedal edema Skin: No rashes  {Labs (Optional):23779}    Assessment & Plan:   Hyperlipidemia, unspecified hyperlipidemia type -     Lipid panel; Future -     POCT glycosylated hemoglobin (Hb A1C)  Adenomatous  polyp of colon, unspecified part of colon Assessment & Plan: Colonoscopy in 2017 and 2019.  GI recommended repeat colonoscopy in 3 years after the 2019 procedure.  Discussed with pt. Recommended pt contact GI to schedule colonscopy   Tobacco use disorder Assessment & Plan: Pt has no desire to quit today. Discussed low dose annual ct chest screenings.  Pt wishes to contact insurance first prior to ordering this.    Prediabetes Assessment & Plan: Counseled on dietary changes and weight loss.    Encounter to establish care    Return in about 1 year (around 06/13/2024) for physical.   Sandre Kitty, MD

## 2023-06-14 NOTE — Patient Instructions (Signed)
It was nice to see you today,  We addressed the following topics today: - your A1c was in the prediabetic range.  Reduce your intake of carbohydrate rich foods to help prevent development of diabetes.  - we will check your cholesterol. Make a lab visit to have this done.  Fast for your lab.   - your gastroenterologist recommendation states you are due for a repeat colonoscopy. Please call them to schedule it.   - please check with your insurance company to see if they cover low dose CT scans for lung cancer screening.  Then we can place an order for it.   - if you want a surgeon to look at your wrist, I can send in a referral.   Have a great day,  Frederic Jericho, MD

## 2023-06-14 NOTE — Assessment & Plan Note (Signed)
Colonoscopy in 2017 and 2019.  GI recommended repeat colonoscopy in 3 years after the 2019 procedure.  Discussed with pt. Recommended pt contact GI to schedule colonscopy

## 2023-06-14 NOTE — Assessment & Plan Note (Signed)
Counseled on dietary changes and weight loss.

## 2023-06-15 DIAGNOSIS — R2232 Localized swelling, mass and lump, left upper limb: Secondary | ICD-10-CM | POA: Insufficient documentation

## 2023-06-15 NOTE — Assessment & Plan Note (Signed)
Differential includes cyst, neuroma, fibroma.  Pt wishes to delay further workup at this time.   - would get ultrasound if pt desires further workup - treatment (drainage vs excision) will depend on imaging results.

## 2023-06-19 ENCOUNTER — Other Ambulatory Visit: Payer: Self-pay

## 2023-06-19 DIAGNOSIS — Z1321 Encounter for screening for nutritional disorder: Secondary | ICD-10-CM

## 2023-06-19 DIAGNOSIS — Z0000 Encounter for general adult medical examination without abnormal findings: Secondary | ICD-10-CM

## 2023-06-19 DIAGNOSIS — Z130 Encounter for screening for diseases of the blood and blood-forming organs and certain disorders involving the immune mechanism: Secondary | ICD-10-CM

## 2023-06-21 ENCOUNTER — Other Ambulatory Visit: Payer: Federal, State, Local not specified - PPO

## 2023-06-23 ENCOUNTER — Other Ambulatory Visit: Payer: Federal, State, Local not specified - PPO

## 2023-06-23 DIAGNOSIS — E785 Hyperlipidemia, unspecified: Secondary | ICD-10-CM | POA: Diagnosis not present

## 2023-06-24 LAB — LIPID PANEL
Chol/HDL Ratio: 5.6 ratio — ABNORMAL HIGH (ref 0.0–5.0)
Cholesterol, Total: 271 mg/dL — ABNORMAL HIGH (ref 100–199)
HDL: 48 mg/dL (ref >39)
LDL Chol Calc (NIH): 134 mg/dL — ABNORMAL HIGH (ref 0–99)
Triglycerides: 490 mg/dL — ABNORMAL HIGH (ref 0–149)
VLDL Cholesterol Cal: 89 mg/dL — ABNORMAL HIGH (ref 5–40)

## 2023-07-11 ENCOUNTER — Other Ambulatory Visit: Payer: Self-pay | Admitting: Family Medicine

## 2023-07-11 DIAGNOSIS — E785 Hyperlipidemia, unspecified: Secondary | ICD-10-CM

## 2023-08-10 ENCOUNTER — Other Ambulatory Visit: Payer: Federal, State, Local not specified - PPO

## 2023-08-10 DIAGNOSIS — E785 Hyperlipidemia, unspecified: Secondary | ICD-10-CM | POA: Diagnosis not present

## 2023-08-11 LAB — LIPID PANEL
Chol/HDL Ratio: 5 ratio (ref 0.0–5.0)
Cholesterol, Total: 216 mg/dL — ABNORMAL HIGH (ref 100–199)
HDL: 43 mg/dL (ref >39)
LDL Chol Calc (NIH): 144 mg/dL — ABNORMAL HIGH (ref 0–99)
Triglycerides: 162 mg/dL — ABNORMAL HIGH (ref 0–149)
VLDL Cholesterol Cal: 29 mg/dL (ref 5–40)

## 2024-05-31 ENCOUNTER — Other Ambulatory Visit: Payer: Self-pay | Admitting: *Deleted

## 2024-05-31 DIAGNOSIS — R7303 Prediabetes: Secondary | ICD-10-CM

## 2024-05-31 DIAGNOSIS — E785 Hyperlipidemia, unspecified: Secondary | ICD-10-CM

## 2024-05-31 DIAGNOSIS — E559 Vitamin D deficiency, unspecified: Secondary | ICD-10-CM

## 2024-06-05 ENCOUNTER — Other Ambulatory Visit: Payer: Federal, State, Local not specified - PPO

## 2024-06-05 DIAGNOSIS — E559 Vitamin D deficiency, unspecified: Secondary | ICD-10-CM

## 2024-06-05 DIAGNOSIS — R7303 Prediabetes: Secondary | ICD-10-CM

## 2024-06-05 DIAGNOSIS — E785 Hyperlipidemia, unspecified: Secondary | ICD-10-CM

## 2024-06-06 LAB — CBC WITH DIFFERENTIAL/PLATELET
Basophils Absolute: 0.1 10*3/uL (ref 0.0–0.2)
Basos: 1 %
EOS (ABSOLUTE): 0.1 10*3/uL (ref 0.0–0.4)
Eos: 2 %
Hematocrit: 46.8 % (ref 37.5–51.0)
Hemoglobin: 15.9 g/dL (ref 13.0–17.7)
Immature Grans (Abs): 0 10*3/uL (ref 0.0–0.1)
Immature Granulocytes: 0 %
Lymphocytes Absolute: 2.5 10*3/uL (ref 0.7–3.1)
Lymphs: 38 %
MCH: 34.6 pg — ABNORMAL HIGH (ref 26.6–33.0)
MCHC: 34 g/dL (ref 31.5–35.7)
MCV: 102 fL — ABNORMAL HIGH (ref 79–97)
Monocytes Absolute: 0.6 10*3/uL (ref 0.1–0.9)
Monocytes: 9 %
Neutrophils Absolute: 3.2 10*3/uL (ref 1.4–7.0)
Neutrophils: 50 %
Platelets: 220 10*3/uL (ref 150–450)
RBC: 4.59 x10E6/uL (ref 4.14–5.80)
RDW: 12.7 % (ref 11.6–15.4)
WBC: 6.4 10*3/uL (ref 3.4–10.8)

## 2024-06-06 LAB — COMPREHENSIVE METABOLIC PANEL WITH GFR
ALT: 11 IU/L (ref 0–44)
AST: 18 IU/L (ref 0–40)
Albumin: 4.3 g/dL (ref 3.8–4.9)
Alkaline Phosphatase: 70 IU/L (ref 44–121)
BUN/Creatinine Ratio: 13 (ref 9–20)
BUN: 11 mg/dL (ref 6–24)
Bilirubin Total: 0.2 mg/dL (ref 0.0–1.2)
CO2: 19 mmol/L — ABNORMAL LOW (ref 20–29)
Calcium: 9.2 mg/dL (ref 8.7–10.2)
Chloride: 103 mmol/L (ref 96–106)
Creatinine, Ser: 0.86 mg/dL (ref 0.76–1.27)
Globulin, Total: 2.1 g/dL (ref 1.5–4.5)
Glucose: 94 mg/dL (ref 70–99)
Potassium: 4.3 mmol/L (ref 3.5–5.2)
Sodium: 139 mmol/L (ref 134–144)
Total Protein: 6.4 g/dL (ref 6.0–8.5)
eGFR: 101 mL/min/{1.73_m2} (ref >59)

## 2024-06-06 LAB — LIPID PANEL
Chol/HDL Ratio: 6.1 ratio — ABNORMAL HIGH (ref 0.0–5.0)
Cholesterol, Total: 263 mg/dL — ABNORMAL HIGH (ref 100–199)
HDL: 43 mg/dL (ref >39)
LDL Chol Calc (NIH): 119 mg/dL — ABNORMAL HIGH (ref 0–99)
Triglycerides: 565 mg/dL (ref 0–149)
VLDL Cholesterol Cal: 101 mg/dL — ABNORMAL HIGH (ref 5–40)

## 2024-06-06 LAB — TSH: TSH: 1.54 u[IU]/mL (ref 0.450–4.500)

## 2024-06-06 LAB — HEMOGLOBIN A1C
Est. average glucose Bld gHb Est-mCnc: 114 mg/dL
Hgb A1c MFr Bld: 5.6 % (ref 4.8–5.6)

## 2024-06-06 LAB — VITAMIN D 25 HYDROXY (VIT D DEFICIENCY, FRACTURES): Vit D, 25-Hydroxy: 28 ng/mL — ABNORMAL LOW (ref 30.0–100.0)

## 2024-06-07 ENCOUNTER — Ambulatory Visit: Payer: Self-pay | Admitting: Family Medicine

## 2024-06-13 ENCOUNTER — Ambulatory Visit (INDEPENDENT_AMBULATORY_CARE_PROVIDER_SITE_OTHER): Payer: Federal, State, Local not specified - PPO | Admitting: Family Medicine

## 2024-06-13 ENCOUNTER — Encounter: Payer: Self-pay | Admitting: Family Medicine

## 2024-06-13 VITALS — BP 131/74 | HR 69 | Ht 68.0 in | Wt 194.1 lb

## 2024-06-13 DIAGNOSIS — Z0000 Encounter for general adult medical examination without abnormal findings: Secondary | ICD-10-CM | POA: Diagnosis not present

## 2024-06-13 DIAGNOSIS — Z23 Encounter for immunization: Secondary | ICD-10-CM

## 2024-06-13 DIAGNOSIS — Z125 Encounter for screening for malignant neoplasm of prostate: Secondary | ICD-10-CM

## 2024-06-13 DIAGNOSIS — Z122 Encounter for screening for malignant neoplasm of respiratory organs: Secondary | ICD-10-CM

## 2024-06-13 DIAGNOSIS — E781 Pure hyperglyceridemia: Secondary | ICD-10-CM

## 2024-06-13 DIAGNOSIS — F17200 Nicotine dependence, unspecified, uncomplicated: Secondary | ICD-10-CM

## 2024-06-13 DIAGNOSIS — R2232 Localized swelling, mass and lump, left upper limb: Secondary | ICD-10-CM

## 2024-06-13 DIAGNOSIS — E559 Vitamin D deficiency, unspecified: Secondary | ICD-10-CM | POA: Diagnosis not present

## 2024-06-13 MED ORDER — ROSUVASTATIN CALCIUM 5 MG PO TABS: 5.0000 mg | ORAL_TABLET | Freq: Every day | ORAL | 3 refills | Status: DC

## 2024-06-13 NOTE — Patient Instructions (Signed)
 It was nice to see you today,  We addressed the following topics today: -I am sending in a statin called Crestor for you to take.  I have provided more information on this medication in this packet.  Take it once a day.  Most common side effects include muscle or joint aches.  If you have any of these please let us  know - Will recheck your labs in 6 weeks I will also check your PSA. - I am sending in referrals for a lung cancer screening test which is a CT scan and a ultrasound of your wrist.  After I get the ultrasound of your wrist we can send in a referral to the surgeon for the appropriate treatment - Please call your gastroenterologist office at [facility] on [facility] to schedule your repeat colonoscopy.  Have a great day,  Rolan Slain, MD

## 2024-06-13 NOTE — Progress Notes (Signed)
 Annual physical  Subjective   Patient ID: Dennis Richard, male    DOB: 10-14-1966  Age: 58 y.o. MRN: [id number]  Chief Complaint  Patient presents with   Annual Exam   HPI Dennis Richard is a 58 y.o. old male here  for annual exam.   Subjective - Reports growth on wrist has increased slightly since last visit one year ago - Continues smoking approximately 5 cigarettes daily (quarter pack), consistent with previous usage - No current desire to quit smoking - Has not completed previously discussed lung cancer screening CT scan - New insurance coverage since last visit - Has not followed up with gastroenterologist as recommended - Last colonoscopy 2019 with Dr. Arlene at [facility], due for repeat (previous large polyp found on first colonoscopy, smaller polyp on second) - Reviewed recent lab results, acknowledges elevated cholesterol - Reports dietary indiscretions during family vacation prior to blood work - Media planner to start statin therapy - Takes daily vitamin D  (uncertain of dose, estimates 1500-2500 units) - Takes turmeric, elderberry supplements - Currently taking niacin for cholesterol  Medications: vitamin D  daily (dose uncertain), turmeric, elderberry, niacin for cholesterol, CoQ10 possibly in multivitamin  PMHx: elevated cholesterol, previous colonic polyps (large polyp initially, smaller polyp on subsequent colonoscopy)  Social Hx: smoking 5 cigarettes daily  ROS: reports growth has increased in size  The 10-year ASCVD risk score (Arnett DK, et al., 2019) is: 18.5%  Health Maintenance Due  Topic Date Due   HIV Screening  Never done   Hepatitis C Screening  Never done   Pneumococcal Vaccine 58 Years old (1 of 2 - PCV) Never done   Hepatitis B Vaccines (1 of 3 - 19+ 3-dose series) Never done   Colonoscopy  03/27/2021   COVID-19 Vaccine (1 - 2024-25 season) Never done      Objective:     BP 131/74   Pulse 69   Ht 5' 8 (1.727 m)   Wt 194 lb 1.9 oz (88.1 kg)    SpO2 97%   BMI 29.52 kg/m    Physical Exam Gen: alert oriented Cv: rrr Pulm: lctab Gi: soft, nbs.   Msk: strength equal b/l.  Firm Nodule of left distal wrist, anteriorly.    No results found for any visits on 06/13/24.      Assessment & Plan:   Physical exam, annual  Nodule of skin of left upper extremity Assessment & Plan: - Ultrasound ordered to characterize lesion - Surgical referral pending ultrasound results  Orders: -     US  SOFT TISSUE UPPER EXTREMITY LIMITED LEFT (NON-VASCULAR); Future  Encounter for screening for lung cancer -     CT CHEST LUNG CANCER SCREENING LOW DOSE WO CONTRAST; Future  Prostate cancer screening -     PSA; Future  Hypertriglyceridemia Assessment & Plan: Hyperlipidemia with triglycerides over 500, elevated cholesterol despite dietary modifications. Willing to initiate statin therapy. - Start Rosuvastatin  (Crestor ) once daily - Discontinue niacin (explained lack of cardiovascular benefit despite cholesterol reduction) - Recheck lipid panel in 6 weeks  Orders: -     Lipid panel; Future -     Comprehensive metabolic panel with GFR; Future  Immunization due -     Varicella-zoster vaccine IM  Tobacco use disorder Assessment & Plan: - Lung cancer screening CT chest ordered - no desire to quit at this time.   - currently smoking about 5 cigarettes a day, more on the weekends   Vitamin D  deficiency Assessment & Plan: - Increase vitamin D   to 4000 units daily   Other orders -     Rosuvastatin  Calcium ; Take 1 tablet (5 mg total) by mouth daily.  Dispense: 90 tablet; Refill: 3     Return in about 6 months (around 12/13/2024) for hld.    Toribio MARLA Slain, MD

## 2024-06-14 DIAGNOSIS — E781 Pure hyperglyceridemia: Secondary | ICD-10-CM | POA: Insufficient documentation

## 2024-06-14 NOTE — Assessment & Plan Note (Signed)
-   Ultrasound ordered to characterize lesion - Surgical referral pending ultrasound results

## 2024-06-14 NOTE — Assessment & Plan Note (Signed)
Increase vitamin D to 4000 units daily.

## 2024-06-14 NOTE — Assessment & Plan Note (Signed)
-   Lung cancer screening CT chest ordered - no desire to quit at this time.   - currently smoking about 5 cigarettes a day, more on the weekends

## 2024-06-14 NOTE — Assessment & Plan Note (Signed)
 Hyperlipidemia with triglycerides over 500, elevated cholesterol despite dietary modifications. Willing to initiate statin therapy. - Start Rosuvastatin  (Crestor ) once daily - Discontinue niacin (explained lack of cardiovascular benefit despite cholesterol reduction) - Recheck lipid panel in 6 weeks

## 2024-07-10 ENCOUNTER — Inpatient Hospital Stay (HOSPITAL_BASED_OUTPATIENT_CLINIC_OR_DEPARTMENT_OTHER)
Admission: RE | Admit: 2024-07-10 | Discharge: 2024-07-10 | Discharge status: Home / Self Care | Source: Ambulatory Visit | Attending: Family Medicine | Admitting: Radiology

## 2024-07-10 ENCOUNTER — Ambulatory Visit (INDEPENDENT_AMBULATORY_CARE_PROVIDER_SITE_OTHER)
Admission: RE | Admit: 2024-07-10 | Discharge: 2024-07-10 | Disposition: A | Discharge status: Home / Self Care | Source: Ambulatory Visit | Attending: Family Medicine | Admitting: Family Medicine

## 2024-07-10 DIAGNOSIS — F17210 Nicotine dependence, cigarettes, uncomplicated: Secondary | ICD-10-CM | POA: Diagnosis not present

## 2024-07-10 DIAGNOSIS — Z122 Encounter for screening for malignant neoplasm of respiratory organs: Secondary | ICD-10-CM | POA: Diagnosis not present

## 2024-07-10 DIAGNOSIS — R2232 Localized swelling, mass and lump, left upper limb: Secondary | ICD-10-CM

## 2024-07-17 ENCOUNTER — Ambulatory Visit: Payer: Self-pay

## 2024-07-25 ENCOUNTER — Other Ambulatory Visit

## 2024-07-25 DIAGNOSIS — E781 Pure hyperglyceridemia: Secondary | ICD-10-CM

## 2024-07-25 DIAGNOSIS — Z125 Encounter for screening for malignant neoplasm of prostate: Secondary | ICD-10-CM

## 2024-07-26 LAB — COMPREHENSIVE METABOLIC PANEL WITH GFR
ALT: 15 IU/L (ref 0–44)
AST: 22 IU/L (ref 0–40)
Albumin: 4.6 g/dL (ref 3.8–4.9)
Alkaline Phosphatase: 64 IU/L (ref 44–121)
BUN/Creatinine Ratio: 14 (ref 9–20)
BUN: 12 mg/dL (ref 6–24)
Bilirubin Total: 0.6 mg/dL (ref 0.0–1.2)
CO2: 19 mmol/L — ABNORMAL LOW (ref 20–29)
Calcium: 9.6 mg/dL (ref 8.7–10.2)
Chloride: 102 mmol/L (ref 96–106)
Creatinine, Ser: 0.84 mg/dL (ref 0.76–1.27)
Globulin, Total: 2 g/dL (ref 1.5–4.5)
Glucose: 102 mg/dL — ABNORMAL HIGH (ref 70–99)
Potassium: 4.4 mmol/L (ref 3.5–5.2)
Sodium: 139 mmol/L (ref 134–144)
Total Protein: 6.6 g/dL (ref 6.0–8.5)
eGFR: 101 mL/min/1.73 (ref >59)

## 2024-07-26 LAB — LIPID PANEL
Chol/HDL Ratio: 4.2 ratio (ref 0.0–5.0)
Cholesterol, Total: 210 mg/dL — ABNORMAL HIGH (ref 100–199)
HDL: 50 mg/dL (ref >39)
LDL Chol Calc (NIH): 125 mg/dL — ABNORMAL HIGH (ref 0–99)
Triglycerides: 196 mg/dL — ABNORMAL HIGH (ref 0–149)
VLDL Cholesterol Cal: 35 mg/dL (ref 5–40)

## 2024-07-26 LAB — PSA: Prostate Specific Ag, Serum: 1.1 ng/mL (ref 0.0–4.0)

## 2024-07-29 ENCOUNTER — Other Ambulatory Visit: Payer: Self-pay | Admitting: Family Medicine

## 2024-07-29 MED ORDER — ROSUVASTATIN CALCIUM 10 MG PO TABS: 10.0000 mg | ORAL_TABLET | Freq: Every day | ORAL | 3 refills | Status: AC

## 2024-07-29 NOTE — Progress Notes (Signed)
 Called pt he is advised of his lab results and recommendation he is agreeable to increasing his medication. [facility] on [address]

## 2024-10-01 ENCOUNTER — Encounter: Payer: Self-pay | Admitting: Gastroenterology

## 2024-10-22 ENCOUNTER — Ambulatory Visit (AMBULATORY_SURGERY_CENTER)

## 2024-10-22 VITALS — Ht 68.0 in | Wt 195.0 lb

## 2024-10-22 DIAGNOSIS — Z860100 Personal history of colon polyps, unspecified: Secondary | ICD-10-CM

## 2024-10-22 MED ORDER — NA SULFATE-K SULFATE-MG SULF 17.5-3.13-1.6 GM/177ML PO SOLN: 1.0000 | Freq: Once | ORAL | 0 refills | Status: AC

## 2024-10-22 NOTE — Progress Notes (Signed)

## 2024-11-05 ENCOUNTER — Encounter: Payer: Self-pay | Admitting: Gastroenterology

## 2024-11-05 ENCOUNTER — Ambulatory Visit: Admitting: Gastroenterology

## 2024-11-05 VITALS — BP 129/65 | HR 60 | Temp 97.9°F | Resp 24 | Ht 68.0 in | Wt 195.0 lb

## 2024-11-05 DIAGNOSIS — Z860100 Personal history of colon polyps, unspecified: Secondary | ICD-10-CM

## 2024-11-05 DIAGNOSIS — Z860101 Personal history of adenomatous and serrated colon polyps: Secondary | ICD-10-CM | POA: Diagnosis not present

## 2024-11-05 DIAGNOSIS — D127 Benign neoplasm of rectosigmoid junction: Secondary | ICD-10-CM

## 2024-11-05 DIAGNOSIS — K5730 Diverticulosis of large intestine without perforation or abscess without bleeding: Secondary | ICD-10-CM | POA: Diagnosis not present

## 2024-11-05 DIAGNOSIS — K648 Other hemorrhoids: Secondary | ICD-10-CM

## 2024-11-05 DIAGNOSIS — Z1211 Encounter for screening for malignant neoplasm of colon: Secondary | ICD-10-CM

## 2024-11-05 DIAGNOSIS — D123 Benign neoplasm of transverse colon: Secondary | ICD-10-CM

## 2024-11-05 DIAGNOSIS — D125 Benign neoplasm of sigmoid colon: Secondary | ICD-10-CM

## 2024-11-05 MED ORDER — SODIUM CHLORIDE 0.9 % IV SOLN: 500.0000 mL | Freq: Once | INTRAVENOUS | Status: AC

## 2024-11-05 NOTE — Progress Notes (Signed)
 Pt's states no medical or surgical changes since previsit or office visit.

## 2024-11-05 NOTE — Progress Notes (Signed)
 A/o x 3, VSS, good SR's, pleased with anesthesia, report to RN

## 2024-11-05 NOTE — Op Note (Signed)
 [facility] Patient Name: Dennis Richard Procedure Date: 11/05/2024 1:51 PM MRN: [id number] Endoscopist: Elspeth P. Leigh , MD, [id number] Age: 58 Referring MD:  Date of Birth: 1966/08/03 Gender: Male Account #: [id number] Procedure:                Colonoscopy Indications:              High risk colon cancer surveillance: Personal                            history of colonic polyps - last exam 2019 - one                            small polyp, but 2017 had advanced adenoma Medicines:                Monitored Anesthesia Care Procedure:                Pre-Anesthesia Assessment:                           - Prior to the procedure, a History and Physical                            was performed, and patient medications and                            allergies were reviewed. The patient's tolerance of                            previous anesthesia was also reviewed. The risks                            and benefits of the procedure and the sedation                            options and risks were discussed with the patient.                            All questions were answered, and informed consent                            was obtained. Prior Anticoagulants: The patient has                            taken no anticoagulant or antiplatelet agents. ASA                            Grade Assessment: II - A patient with mild systemic                            disease. After reviewing the risks and benefits,                            the patient was deemed in satisfactory condition to  undergo the procedure.                           After obtaining informed consent, the colonoscope                            was passed under direct vision. Throughout the                            procedure, the patient's blood pressure, pulse, and                            oxygen saturations were monitored continuously. The                            Olympus Scope  [id number] was introduced through the                            anus and advanced to the the cecum, identified by                            appendiceal orifice and ileocecal valve. The                            colonoscopy was performed without difficulty. The                            patient tolerated the procedure well. The quality                            of the bowel preparation was adequate. The                            ileocecal valve, appendiceal orifice, and rectum                            were photographed. Scope In: 2:03:23 PM Scope Out: 2:21:54 PM Scope Withdrawal Time: 0 hours 13 minutes 33 seconds  Total Procedure Duration: 0 hours 18 minutes 31 seconds  Findings:                 The perianal and digital rectal examinations were                            normal.                           A 4 mm polyp was found in the hepatic flexure. The                            polyp was sessile. The polyp was removed with a                            cold snare. Resection and retrieval were complete.  Two sessile polyps were found in the transverse                            colon. The polyps were 3 to 5 mm in size. These                            polyps were removed with a cold snare. Resection                            and retrieval were complete.                           A 3 mm polyp was found in the sigmoid colon. The                            polyp was sessile. The polyp was removed with a                            cold snare. Resection and retrieval were complete.                           A 3 to 4 mm polyp was found in the recto-sigmoid                            colon. The polyp was sessile. The polyp was removed                            with a cold snare. Resection and retrieval were                            complete.                           A few small-mouthed diverticula were found in the                            sigmoid  colon.                           Internal hemorrhoids were found during retroflexion.                           The exam was otherwise without abnormality. Complications:            No immediate complications. Estimated blood loss:                            Minimal. Estimated Blood Loss:     Estimated blood loss was minimal. Impression:               - One 4 mm polyp at the hepatic flexure, removed                            with a cold snare. Resected and retrieved.                           -  Two 3 to 5 mm polyps in the transverse colon,                            removed with a cold snare. Resected and retrieved.                           - One 3 mm polyp in the sigmoid colon, removed with                            a cold snare. Resected and retrieved.                           - One 3 to 4 mm polyp at the recto-sigmoid colon,                            removed with a cold snare. Resected and retrieved.                           - Diverticulosis in the sigmoid colon.                           - Internal hemorrhoids.                           - The examination was otherwise normal. Recommendation:           - Patient has a contact number available for                            emergencies. The signs and symptoms of potential                            delayed complications were discussed with the                            patient. Return to normal activities tomorrow.                            Written discharge instructions were provided to the                            patient.                           - Resume previous diet.                           - Continue present medications.                           - Await pathology results. Elspeth P. Leigh, MD 11/05/2024 2:28:32 PM This report has been signed electronically.

## 2024-11-05 NOTE — Patient Instructions (Signed)

## 2024-11-05 NOTE — Progress Notes (Signed)
 [facility] History and Physical   Primary Care Physician:  Chandra Toribio POUR, MD   Reason for Procedure:   History of polyps  Plan:    colonoscopy     HPI: Dennis Richard is a 58 y.o. male  here for colonoscopy surveillance - advanced adenoma 2017, last exam 2019.     Patient denies any bowel symptoms at this time. No family history of colon cancer known. Otherwise feels well without any cardiopulmonary symptoms.   I have discussed risks / benefits of anesthesia and endoscopic procedure with Leviticus D Pebley and they wish to proceed with the exams as outlined today.   The patient was provided an opportunity to ask questions and all were answered. The patient agreed with the plan.    Past Medical History:  Diagnosis Date   Acute appendicitis 11/17/2015   Anemia    childhood   Chicken pox    Emphysema of lung (HCC)    GERD (gastroesophageal reflux disease)    Substance abuse (HCC)    Cigarettes   Umbilical hernia     Past Surgical History:  Procedure Laterality Date   ACHILLES TENDON REPAIR Right 2009   achilles rupture and repair   APPENDECTOMY  11/17/2015   COLONOSCOPY  2017   INGUINAL HERNIA REPAIR Bilateral 1995   [place] x3   LAPAROSCOPIC APPENDECTOMY N/A 11/17/2015   Procedure: APPENDECTOMY LAPAROSCOPIC;  Surgeon: Lynda Leos, MD;  Location: [facility] OR;  Service: General;  Laterality: N/A;   POLYPECTOMY     TONSILLECTOMY     WISDOM TOOTH EXTRACTION      Prior to Admission medications   Medication Sig Start Date End Date Taking? Authorizing Provider  BIOTIN PO Take by mouth.   Yes [provider]  cholecalciferol (VITAMIN D3) 25 MCG (1000 UNIT) tablet Take 1,000 Units by mouth daily.   Yes [provider]  CINNAMON PO Take by mouth.   Yes [provider]  ELDERBERRY PO Take by mouth.   Yes [provider]  LYSINE PO Take by mouth.   Yes [provider]  Multiple Vitamins-Minerals (MULTIVITAMIN  ADULTS PO) Take by mouth.   Yes [provider]  Probiotic Product (PROBIOTIC DAILY PO) Take by mouth.   Yes [provider]  rosuvastatin  (CRESTOR ) 10 MG tablet Take 1 tablet (10 mg total) by mouth daily. 07/29/24  Yes Chandra Toribio POUR, MD  Specialty Vitamins Products (BILBERRY PLUS PO) Take by mouth.   Yes [provider]  TURMERIC PO Take by mouth.   Yes [provider]  Zn-Pyg Afri-Nettle-Saw Palmet (SAW PALMETTO COMPLEX PO) Take by mouth. Pomegranate, grape seed oil   Yes [provider]    Current Outpatient Medications  Medication Sig Dispense Refill   BIOTIN PO Take by mouth.     cholecalciferol (VITAMIN D3) 25 MCG (1000 UNIT) tablet Take 1,000 Units by mouth daily.     CINNAMON PO Take by mouth.     ELDERBERRY PO Take by mouth.     LYSINE PO Take by mouth.     Multiple Vitamins-Minerals (MULTIVITAMIN ADULTS PO) Take by mouth.     Probiotic Product (PROBIOTIC DAILY PO) Take by mouth.     rosuvastatin  (CRESTOR ) 10 MG tablet Take 1 tablet (10 mg total) by mouth daily. 90 tablet 3   Specialty Vitamins Products (BILBERRY PLUS PO) Take by mouth.     TURMERIC PO Take by mouth.     Zn-Pyg Afri-Nettle-Saw Palmet (SAW PALMETTO COMPLEX  PO) Take by mouth. Pomegranate, grape seed oil     Current Facility-Administered Medications  Medication Dose Route Frequency Provider Last Rate Last Admin   0.9 %  sodium chloride  infusion  500 mL Intravenous Once Ares Cardozo, Elspeth SQUIBB, MD        Allergies as of 11/05/2024   (No Known Allergies)    Family History  Problem Relation Age of Onset   Hyperlipidemia Mother    Hypertension Mother    Hyperlipidemia Father    Diabetes Father    Hypertension Father    Heart disease Maternal Grandmother    Heart disease Paternal Grandmother    Diabetes Paternal Grandmother    Hyperlipidemia Paternal Grandfather    Hypertension Paternal Grandfather    Colon cancer Neg Hx    Colon polyps Neg Hx    Esophageal  cancer Neg Hx    Rectal cancer Neg Hx    Stomach cancer Neg Hx     Social History   Socioeconomic History   Marital status: Married    Spouse name: Not on file   Number of children: 2   Years of education: 14   Highest education level: Not on file  Occupational History   Occupation: Rural [employer]   Tobacco Use   Smoking status: Some Days    Current packs/day: 0.25    Average packs/day: 0.3 packs/day for 33.2 years (8.3 ttl pk-yrs)    Types: Cigarettes   Smokeless tobacco: Former    Types: Snuff, Chew  Vaping Use   Vaping status: Never Used  Substance and Sexual Activity   Alcohol use: Yes    Alcohol/week: 11.0 standard drinks of alcohol    Types: 5 Glasses of wine, 6 Cans of beer per week   Drug use: No   Sexual activity: Yes    Birth control/protection: None  Other Topics Concern   Not on file  Social History Narrative   Fun: Play music and ride 4 wheelers   Social Drivers of Health   Financial Resource Strain: Not on file  Food Insecurity: Not on file  Transportation Needs: Not on file  Physical Activity: Not on file  Stress: Not on file  Social Connections: Not on file  Intimate Partner Violence: Not on file    Review of Systems: All other review of systems negative except as mentioned in the HPI.  Physical Exam: Vital signs BP 121/64   Pulse 60   Temp 97.9 F (36.6 C) (Temporal)   Ht 5' 8 (1.727 m)   Wt 195 lb (88.5 kg)   SpO2 96%   BMI 29.65 kg/m   General:   Alert,  Well-developed, pleasant and cooperative in NAD Lungs:  Clear throughout to auscultation.   Heart:  Regular rate and rhythm Abdomen:  Soft, nontender and nondistended.   Neuro/Psych:  Alert and cooperative. Normal mood and affect. A and O x 3  Marcey Naval, MD [facility]

## 2024-11-06 ENCOUNTER — Telehealth: Payer: Self-pay | Admitting: *Deleted

## 2024-11-06 NOTE — Telephone Encounter (Signed)
  Follow up Call-     11/05/2024    1:20 PM  Call back number  Post procedure Call Back phone  # [phone]  Permission to leave phone message Yes     Patient questions:  Do you have a fever, pain , or abdominal swelling? No. Pain Score  0 *  Have you tolerated food without any problems? Yes.    Have you been able to return to your normal activities? Yes.    Do you have any questions about your discharge instructions: Diet   No. Medications  No. Follow up visit  No.  Do you have questions or concerns about your Care? No.  Actions: * If pain score is 4 or above: No action needed, pain <4.

## 2024-11-08 LAB — SURGICAL PATHOLOGY

## 2024-11-09 ENCOUNTER — Ambulatory Visit: Payer: Self-pay | Admitting: Gastroenterology

## 2024-11-28 ENCOUNTER — Other Ambulatory Visit: Payer: Self-pay | Admitting: Family Medicine

## 2024-11-28 DIAGNOSIS — E785 Hyperlipidemia, unspecified: Secondary | ICD-10-CM

## 2024-11-28 DIAGNOSIS — E781 Pure hyperglyceridemia: Secondary | ICD-10-CM

## 2024-12-09 ENCOUNTER — Other Ambulatory Visit

## 2024-12-10 ENCOUNTER — Ambulatory Visit: Payer: Self-pay | Admitting: Family Medicine

## 2024-12-10 LAB — COMPREHENSIVE METABOLIC PANEL WITH GFR
ALT: 16 IU/L (ref 0–44)
AST: 27 IU/L (ref 0–40)
Albumin: 4.4 g/dL (ref 3.8–4.9)
Alkaline Phosphatase: 65 IU/L (ref 47–123)
BUN/Creatinine Ratio: 14 (ref 9–20)
BUN: 12 mg/dL (ref 6–24)
Bilirubin Total: 0.3 mg/dL (ref 0.0–1.2)
CO2: 21 mmol/L (ref 20–29)
Calcium: 9 mg/dL (ref 8.7–10.2)
Chloride: 102 mmol/L (ref 96–106)
Creatinine, Ser: 0.87 mg/dL (ref 0.76–1.27)
Globulin, Total: 2.1 g/dL (ref 1.5–4.5)
Glucose: 112 mg/dL — ABNORMAL HIGH (ref 70–99)
Potassium: 4.4 mmol/L (ref 3.5–5.2)
Sodium: 139 mmol/L (ref 134–144)
Total Protein: 6.5 g/dL (ref 6.0–8.5)
eGFR: 100 mL/min/1.73

## 2024-12-10 LAB — LIPID PANEL
Chol/HDL Ratio: 4.6 ratio (ref 0.0–5.0)
Cholesterol, Total: 219 mg/dL — ABNORMAL HIGH (ref 100–199)
HDL: 48 mg/dL
LDL Chol Calc (NIH): 60 mg/dL (ref 0–99)
Triglycerides: 747 mg/dL (ref 0–149)
VLDL Cholesterol Cal: 111 mg/dL — ABNORMAL HIGH (ref 5–40)

## 2024-12-16 ENCOUNTER — Ambulatory Visit: Admitting: Family Medicine

## 2024-12-16 ENCOUNTER — Encounter: Payer: Self-pay | Admitting: Family Medicine

## 2024-12-16 VITALS — BP 131/77 | HR 74 | Ht 68.0 in | Wt 201.8 lb

## 2024-12-16 DIAGNOSIS — J439 Emphysema, unspecified: Secondary | ICD-10-CM | POA: Diagnosis not present

## 2024-12-16 DIAGNOSIS — Z23 Encounter for immunization: Secondary | ICD-10-CM

## 2024-12-16 DIAGNOSIS — J341 Cyst and mucocele of nose and nasal sinus: Secondary | ICD-10-CM | POA: Insufficient documentation

## 2024-12-16 DIAGNOSIS — E781 Pure hyperglyceridemia: Secondary | ICD-10-CM

## 2024-12-16 DIAGNOSIS — R2232 Localized swelling, mass and lump, left upper limb: Secondary | ICD-10-CM

## 2024-12-16 MED ORDER — FENOFIBRATE 54 MG PO TABS: 54.0000 mg | ORAL_TABLET | Freq: Every day | ORAL | 1 refills | Status: AC

## 2024-12-16 NOTE — Assessment & Plan Note (Signed)
 Nasal cyst likely benign. Previous ENT advised moisturizing and saline solution. No intervention unless symptomatic. - Continue to monitor nasal cyst for changes or symptoms.

## 2024-12-16 NOTE — Assessment & Plan Note (Signed)
 Elevated triglycerides. LDL controlled with Crestor . Non-fasting lipid panel may affect triglyceride levels. Statins target LDL; fibric acid derivatives target triglycerides. - Continue Crestor  10 mg daily. - Added fenofibrate to regimen. - Provided information on fibric acid derivative. - Recheck lipid panel in 3 months with fasting labs. - Discussed potential side effects of fibric acid derivative, including muscle and joint aches. - Consider Vascepa if fibric acid derivative is not tolerated.

## 2024-12-16 NOTE — Assessment & Plan Note (Signed)
 Mild emphysema on CT. Smoking reduced to six cigarettes per week. Breathing improved. - Encouraged complete smoking cessation. - continue yearly ct scans for lung cancer screening

## 2024-12-16 NOTE — Progress Notes (Unsigned)
 "  Established Patient Office Visit  Subjective   Patient ID: Dennis Richard, male    DOB: 12-Sep-1966  Age: 58 y.o. MRN: [id number]  Chief Complaint  Patient presents with   Medical Management of Chronic Issues    Discussed the use of AI scribe software for clinical note transcription with the patient, who gave verbal consent to proceed.  History of Present Illness   Dennis Richard is a 58 year old male with mild emphysema who presents for follow-up on smoking cessation and cholesterol management.  After a CT scan revealed mild emphysema, he has reduced his smoking from daily to six cigarettes per week, primarily on weekends. He notes an improvement in his breathing since cutting back. No current issues with breathing.  His recent triglyceride levels were elevated, although the test was not conducted while fasting. He attributes this to increased consumption of sweets during the holiday season, which is atypical for him. He continues to take Crestor  for cholesterol management and is due to pick up his prescription soon.  He recalls a previous colonoscopy where three precancerous polyps were found, and he was advised to return for another colonoscopy in 2033.  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 He has been using saline solution and a moisturizing salve. He has not pursued further surgical intervention for the nasal tumor at this time.  He works as a museum/gallery curator, which influences his dietary habits during the holiday season.          The 10-year ASCVD risk score (Arnett DK, et al., 2019) is: 14.4%  Health Maintenance Due  Topic Date Due   HIV Screening  Never done   Hepatitis C Screening  Never done   Hepatitis B Vaccines 19-59 Average Risk (1 of 3 - 19+ 3-dose series) Never done   Zoster Vaccines- Shingrix  (2 of 2) 08/08/2024   COVID-19 Vaccine (1 - 2025-26 season) Never done      Objective:     BP 131/77   Pulse 74   Ht 5' 8 (1.727 m)   Wt  201 lb 12.8 oz (91.5 kg)   SpO2 95%   BMI 30.68 kg/m  {Vitals History (Optional):23777}  Physical Exam     Gen: alert, oriented Pulm: no respiratory distress Psych: pleasant affect Ext: well circumscribed subcutaneous cyst on anterior left distal forearm.  Nontender.         No results found for any visits on 12/16/24.      Assessment & Plan:   Hypertriglyceridemia Assessment & Plan: Elevated triglycerides. LDL controlled with Crestor . Non-fasting lipid panel may affect triglyceride levels. Statins target LDL; fibric acid derivatives target triglycerides. - Continue Crestor  10 mg daily. - Added fenofibrate to regimen. - Provided information on fibric acid derivative. - Recheck lipid panel in 3 months with fasting labs. - Discussed potential side effects of fibric acid derivative, including muscle and joint aches. - Consider Vascepa if fibric acid derivative is not tolerated.  Orders: -     Lipid panel; Future  Nodule of skin of left upper extremity Assessment & Plan: Consistent with ganglion cyst on ultrasound.  Pt declines removal at this time   Nasal sinus cyst Assessment & Plan: Nasal cyst likely benign. Previous ENT advised moisturizing and saline solution. No intervention unless symptomatic. - Continue to monitor nasal cyst for changes or symptoms.    Emphysema lung (HCC) Assessment & Plan: Mild emphysema on CT. Smoking reduced to six cigarettes per week.  Breathing improved. - Encouraged complete smoking cessation. - continue yearly ct scans for lung cancer screening   Other orders -     Fenofibrate; Take 1 tablet (54 mg total) by mouth daily.  Dispense: 90 tablet; Refill: 1         Return in about 6 months (around 06/16/2025) for physical.    Toribio MARLA Slain, MD   "

## 2024-12-16 NOTE — Patient Instructions (Signed)
" °  VISIT SUMMARY: Today, we discussed your progress with smoking cessation, cholesterol management, and other health concerns. You have made significant strides in reducing your smoking and managing your cholesterol levels. We also addressed your nasal cyst and general health maintenance needs.  YOUR PLAN: MIXED HYPERLIPIDEMIA: Your triglyceride levels were elevated, but your LDL is controlled with Crestor . The recent test was not fasting, which may have affected the results. -Continue taking Crestor  10 mg daily. -We have added a fibric acid derivative to your regimen to help manage your triglycerides. -Recheck your lipid panel in 3 months with fasting labs. -Be aware of potential side effects of the fibric acid derivative, including muscle and joint aches. -Consider Vascepa if the fibric acid derivative is not tolerated.  EMPHYSEMA: You have mild emphysema and have reduced your smoking to six cigarettes per week, which has improved your breathing. -Continue to work towards complete smoking cessation.  GENERAL HEALTH MAINTENANCE: You are due for a second shingles vaccine and a follow-up colonoscopy in 2030 due to previous precancerous findings. -You received your second shingles vaccine today. -Your follow-up colonoscopy is scheduled for 2030.    "

## 2024-12-16 NOTE — Assessment & Plan Note (Signed)
 Consistent with ganglion cyst on ultrasound.  Pt declines removal at this time

## 2025-01-15 ENCOUNTER — Telehealth: Admitting: Physician Assistant

## 2025-01-15 DIAGNOSIS — J069 Acute upper respiratory infection, unspecified: Secondary | ICD-10-CM

## 2025-01-15 MED ORDER — BENZONATATE 100 MG PO CAPS: 100.0000 mg | ORAL_CAPSULE | Freq: Three times a day (TID) | ORAL | 0 refills | Status: AC | PRN

## 2025-01-15 MED ORDER — FLUTICASONE PROPIONATE 50 MCG/ACT NA SUSP: 2.0000 | Freq: Every day | NASAL | 0 refills | Status: AC

## 2025-01-15 NOTE — Progress Notes (Signed)

## 2025-03-18 ENCOUNTER — Other Ambulatory Visit

## 2025-06-10 ENCOUNTER — Other Ambulatory Visit

## 2025-06-17 ENCOUNTER — Encounter: Admitting: Family Medicine
